# Patient Record
Sex: Female | Born: 1980 | Race: White | Hispanic: No | Marital: Married | Smoking: Never smoker
Health system: Southern US, Community
[De-identification: ages and names within clinical notes are randomized; demographics above are authoritative.]

## PROBLEM LIST (undated history)

## (undated) DIAGNOSIS — R233 Spontaneous ecchymoses: Secondary | ICD-10-CM

## (undated) DIAGNOSIS — R51 Headache: Secondary | ICD-10-CM

## (undated) DIAGNOSIS — M1990 Unspecified osteoarthritis, unspecified site: Secondary | ICD-10-CM

## (undated) DIAGNOSIS — J45909 Unspecified asthma, uncomplicated: Secondary | ICD-10-CM

## (undated) DIAGNOSIS — Z8614 Personal history of Methicillin resistant Staphylococcus aureus infection: Secondary | ICD-10-CM

## (undated) DIAGNOSIS — Z8709 Personal history of other diseases of the respiratory system: Secondary | ICD-10-CM

## (undated) DIAGNOSIS — M797 Fibromyalgia: Secondary | ICD-10-CM

## (undated) DIAGNOSIS — R238 Other skin changes: Secondary | ICD-10-CM

## (undated) DIAGNOSIS — J3501 Chronic tonsillitis: Principal | ICD-10-CM

## (undated) DIAGNOSIS — F419 Anxiety disorder, unspecified: Secondary | ICD-10-CM

## (undated) HISTORY — PX: TUBAL LIGATION: SHX77

## (undated) HISTORY — PX: TONSILLECTOMY: SUR1361

## (undated) HISTORY — PX: WISDOM TOOTH EXTRACTION: SHX21

---
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
## 2005-10-03 DIAGNOSIS — Z8709 Personal history of other diseases of the respiratory system: Secondary | ICD-10-CM

## 2005-10-03 DIAGNOSIS — Z8614 Personal history of Methicillin resistant Staphylococcus aureus infection: Secondary | ICD-10-CM

## 2005-10-03 HISTORY — PX: CHEST TUBE INSERTION: SHX231

## 2005-10-03 HISTORY — DX: Personal history of Methicillin resistant Staphylococcus aureus infection: Z86.14

## 2005-10-03 HISTORY — DX: Personal history of other diseases of the respiratory system: Z87.09

## 2006-02-09 ENCOUNTER — Ambulatory Visit: Payer: Self-pay | Admitting: Family Medicine

## 2006-02-13 ENCOUNTER — Ambulatory Visit: Payer: Self-pay | Admitting: Family Medicine

## 2006-02-16 ENCOUNTER — Ambulatory Visit: Payer: Self-pay | Admitting: Family Medicine

## 2008-09-13 ENCOUNTER — Emergency Department (HOSPITAL_COMMUNITY): Admission: EM | Admit: 2008-09-13 | Discharge: 2008-09-13 | Payer: Self-pay | Admitting: Emergency Medicine

## 2009-07-22 ENCOUNTER — Ambulatory Visit (HOSPITAL_COMMUNITY): Admission: RE | Admit: 2009-07-22 | Discharge: 2009-07-23 | Payer: Self-pay | Admitting: Neurosurgery

## 2009-07-22 HISTORY — PX: ANTERIOR CERVICAL DECOMP/DISCECTOMY FUSION: SHX1161

## 2009-09-16 ENCOUNTER — Encounter: Admission: RE | Admit: 2009-09-16 | Discharge: 2009-09-16 | Payer: Self-pay | Admitting: Neurosurgery

## 2010-11-30 ENCOUNTER — Encounter (HOSPITAL_BASED_OUTPATIENT_CLINIC_OR_DEPARTMENT_OTHER): Payer: Federal, State, Local not specified - PPO | Admitting: Oncology

## 2010-11-30 DIAGNOSIS — R233 Spontaneous ecchymoses: Secondary | ICD-10-CM

## 2010-12-28 ENCOUNTER — Other Ambulatory Visit: Payer: Self-pay | Admitting: Oncology

## 2010-12-28 ENCOUNTER — Encounter (HOSPITAL_BASED_OUTPATIENT_CLINIC_OR_DEPARTMENT_OTHER): Payer: Federal, State, Local not specified - PPO | Admitting: Oncology

## 2010-12-28 DIAGNOSIS — R233 Spontaneous ecchymoses: Secondary | ICD-10-CM

## 2010-12-28 LAB — CBC WITH DIFFERENTIAL/PLATELET
BASO%: 0.2 % (ref 0.0–2.0)
Basophils Absolute: 0 10*3/uL (ref 0.0–0.1)
LYMPH%: 32.9 % (ref 14.0–49.7)
MCV: 85.6 fL (ref 79.5–101.0)
MONO%: 4.5 % (ref 0.0–14.0)
NEUT%: 61.1 % (ref 38.4–76.8)
Platelets: 304 10*3/uL (ref 145–400)
RDW: 12.4 % (ref 11.2–14.5)
WBC: 4.5 10*3/uL (ref 3.9–10.3)

## 2010-12-29 LAB — COMPREHENSIVE METABOLIC PANEL
ALT: 13 U/L (ref 0–35)
Albumin: 4.3 g/dL (ref 3.5–5.2)
Alkaline Phosphatase: 45 U/L (ref 39–117)
BUN: 10 mg/dL (ref 6–23)
CO2: 21 mEq/L (ref 19–32)
Chloride: 109 mEq/L (ref 96–112)
Total Protein: 7.2 g/dL (ref 6.0–8.3)

## 2010-12-29 LAB — PROTHROMBIN TIME: INR: 0.98 (ref <1.50)

## 2011-01-06 LAB — CBC
HCT: 39.1 % (ref 36.0–46.0)
MCHC: 34.5 g/dL (ref 30.0–36.0)
MCV: 88 fL (ref 78.0–100.0)
Platelets: 247 10*3/uL (ref 150–400)
RBC: 4.44 MIL/uL (ref 3.87–5.11)
RDW: 12.9 % (ref 11.5–15.5)
WBC: 6.8 10*3/uL (ref 4.0–10.5)

## 2011-05-05 ENCOUNTER — Other Ambulatory Visit: Payer: Self-pay | Admitting: Oncology

## 2011-05-05 ENCOUNTER — Encounter (HOSPITAL_BASED_OUTPATIENT_CLINIC_OR_DEPARTMENT_OTHER): Payer: Federal, State, Local not specified - PPO | Admitting: Oncology

## 2011-05-05 DIAGNOSIS — R233 Spontaneous ecchymoses: Secondary | ICD-10-CM

## 2011-05-05 LAB — CBC WITH DIFFERENTIAL/PLATELET
Basophils Absolute: 0 10*3/uL (ref 0.0–0.1)
HCT: 35.6 % (ref 34.8–46.6)
HGB: 12.2 g/dL (ref 11.6–15.9)
MCH: 29.7 pg (ref 25.1–34.0)
MCHC: 34.3 g/dL (ref 31.5–36.0)
MCV: 86.8 fL (ref 79.5–101.0)
Platelets: 286 10*3/uL (ref 145–400)
RDW: 12.6 % (ref 11.2–14.5)

## 2011-05-09 LAB — VON WILLEBRAND PANEL
Coagulation Factor VIII: 193 % — ABNORMAL HIGH (ref 73–140)
Ristocetin Co-factor, Plasma: 125 % (ref 42–200)
Von Willebrand Antigen, Plasma: 108 % (ref 50–217)

## 2011-07-08 LAB — URINE MICROSCOPIC-ADD ON

## 2011-07-08 LAB — URINALYSIS, ROUTINE W REFLEX MICROSCOPIC
Bilirubin Urine: NEGATIVE
Ketones, ur: NEGATIVE mg/dL
Nitrite: NEGATIVE

## 2012-05-30 ENCOUNTER — Other Ambulatory Visit: Payer: Self-pay | Admitting: Internal Medicine

## 2012-05-30 DIAGNOSIS — M545 Low back pain: Secondary | ICD-10-CM

## 2012-06-06 ENCOUNTER — Ambulatory Visit
Admission: RE | Admit: 2012-06-06 | Discharge: 2012-06-06 | Disposition: A | Discharge status: Home / Self Care | Payer: Federal, State, Local not specified - PPO | Source: Ambulatory Visit | Attending: Internal Medicine | Admitting: Internal Medicine

## 2012-06-06 DIAGNOSIS — M545 Low back pain: Secondary | ICD-10-CM

## 2012-09-02 DIAGNOSIS — J3501 Chronic tonsillitis: Secondary | ICD-10-CM

## 2012-09-02 HISTORY — DX: Chronic tonsillitis: J35.01

## 2012-09-24 ENCOUNTER — Encounter (HOSPITAL_BASED_OUTPATIENT_CLINIC_OR_DEPARTMENT_OTHER): Payer: Self-pay | Admitting: *Deleted

## 2012-09-24 NOTE — Pre-Procedure Instructions (Signed)
Sleep study results req. from [facility]

## 2012-10-01 ENCOUNTER — Encounter (HOSPITAL_BASED_OUTPATIENT_CLINIC_OR_DEPARTMENT_OTHER): Payer: Self-pay

## 2012-10-01 ENCOUNTER — Encounter (HOSPITAL_BASED_OUTPATIENT_CLINIC_OR_DEPARTMENT_OTHER): Payer: Self-pay | Admitting: Anesthesiology

## 2012-10-01 ENCOUNTER — Encounter (HOSPITAL_BASED_OUTPATIENT_CLINIC_OR_DEPARTMENT_OTHER)
Admission: RE | Disposition: A | Discharge status: Home / Self Care | Payer: Self-pay | Source: Ambulatory Visit | Attending: Otolaryngology

## 2012-10-01 ENCOUNTER — Ambulatory Visit (HOSPITAL_BASED_OUTPATIENT_CLINIC_OR_DEPARTMENT_OTHER): Payer: Federal, State, Local not specified - PPO | Admitting: Anesthesiology

## 2012-10-01 ENCOUNTER — Ambulatory Visit (HOSPITAL_BASED_OUTPATIENT_CLINIC_OR_DEPARTMENT_OTHER)
Admission: RE | Admit: 2012-10-01 | Discharge: 2012-10-01 | Disposition: A | Discharge status: Home / Self Care | Payer: Federal, State, Local not specified - PPO | Source: Ambulatory Visit | Attending: Otolaryngology | Admitting: Otolaryngology

## 2012-10-01 DIAGNOSIS — Z8614 Personal history of Methicillin resistant Staphylococcus aureus infection: Secondary | ICD-10-CM | POA: Insufficient documentation

## 2012-10-01 DIAGNOSIS — J3501 Chronic tonsillitis: Secondary | ICD-10-CM | POA: Insufficient documentation

## 2012-10-01 DIAGNOSIS — M1710 Unilateral primary osteoarthritis, unspecified knee: Secondary | ICD-10-CM | POA: Insufficient documentation

## 2012-10-01 DIAGNOSIS — J45909 Unspecified asthma, uncomplicated: Secondary | ICD-10-CM | POA: Insufficient documentation

## 2012-10-01 DIAGNOSIS — Z9089 Acquired absence of other organs: Secondary | ICD-10-CM

## 2012-10-01 DIAGNOSIS — IMO0001 Reserved for inherently not codable concepts without codable children: Secondary | ICD-10-CM | POA: Insufficient documentation

## 2012-10-01 HISTORY — DX: Personal history of Methicillin resistant Staphylococcus aureus infection: Z86.14

## 2012-10-01 HISTORY — DX: Unspecified asthma, uncomplicated: J45.909

## 2012-10-01 HISTORY — DX: Headache: R51

## 2012-10-01 HISTORY — DX: Chronic tonsillitis: J35.01

## 2012-10-01 HISTORY — DX: Personal history of other diseases of the respiratory system: Z87.09

## 2012-10-01 HISTORY — DX: Unspecified osteoarthritis, unspecified site: M19.90

## 2012-10-01 HISTORY — PX: TONSILLECTOMY: SHX5217

## 2012-10-01 HISTORY — DX: Fibromyalgia: M79.7

## 2012-10-01 SURGERY — TONSILLECTOMY
Anesthesia: General | Site: Mouth | Wound class: Clean Contaminated

## 2012-10-01 MED ORDER — HYDROCODONE-ACETAMINOPHEN 7.5-500 MG/15ML PO SOLN
10.0000 mL | ORAL | Status: DC | PRN
  Administered 2012-10-01 (×2): 15 mL via ORAL

## 2012-10-01 MED ORDER — PHENOL 1.4 % MT LIQD: 1.0000 | OROMUCOSAL | Status: DC | PRN

## 2012-10-01 MED ORDER — PROMETHAZINE HCL 25 MG PO TABS: 25.0000 mg | ORAL_TABLET | Freq: Four times a day (QID) | ORAL | Status: DC | PRN

## 2012-10-01 MED ORDER — FENTANYL CITRATE 0.05 MG/ML IJ SOLN: 50.0000 ug | INTRAMUSCULAR | Status: DC | PRN

## 2012-10-01 MED ORDER — DEXTROSE-NACL 5-0.9 % IV SOLN
INTRAVENOUS | Status: DC
  Administered 2012-10-01: 10:00:00 via INTRAVENOUS

## 2012-10-01 MED ORDER — MIDAZOLAM HCL 5 MG/5ML IJ SOLN
INTRAMUSCULAR | Status: DC | PRN
  Administered 2012-10-01: 2 mg via INTRAVENOUS

## 2012-10-01 MED ORDER — FENTANYL CITRATE 0.05 MG/ML IJ SOLN
INTRAMUSCULAR | Status: DC | PRN
  Administered 2012-10-01: 100 ug via INTRAVENOUS
  Administered 2012-10-01 (×2): 50 ug via INTRAVENOUS

## 2012-10-01 MED ORDER — MIDAZOLAM HCL 2 MG/2ML IJ SOLN: 1.0000 mg | INTRAMUSCULAR | Status: DC | PRN

## 2012-10-01 MED ORDER — MEPERIDINE HCL 25 MG/ML IJ SOLN: 6.2500 mg | INTRAMUSCULAR | Status: DC | PRN

## 2012-10-01 MED ORDER — OXYCODONE HCL 5 MG/5ML PO SOLN: 5.0000 mg | Freq: Once | ORAL | Status: DC | PRN

## 2012-10-01 MED ORDER — LIDOCAINE HCL (CARDIAC) 20 MG/ML IV SOLN
INTRAVENOUS | Status: DC | PRN
  Administered 2012-10-01: 80 mg via INTRAVENOUS

## 2012-10-01 MED ORDER — PROMETHAZINE HCL 25 MG RE SUPP: 25.0000 mg | Freq: Four times a day (QID) | RECTAL | Status: DC | PRN

## 2012-10-01 MED ORDER — IBUPROFEN 100 MG/5ML PO SUSP
400.0000 mg | Freq: Four times a day (QID) | ORAL | Status: DC | PRN
  Administered 2012-10-01: 20 mg via ORAL

## 2012-10-01 MED ORDER — OXYCODONE HCL 5 MG PO TABS: 5.0000 mg | ORAL_TABLET | Freq: Once | ORAL | Status: DC | PRN

## 2012-10-01 MED ORDER — LACTATED RINGERS IV SOLN
INTRAVENOUS | Status: DC
  Administered 2012-10-01 (×2): via INTRAVENOUS

## 2012-10-01 MED ORDER — ALBUTEROL SULFATE HFA 108 (90 BASE) MCG/ACT IN AERS: 2.0000 | INHALATION_SPRAY | Freq: Four times a day (QID) | RESPIRATORY_TRACT | Status: DC | PRN

## 2012-10-01 MED ORDER — PROMETHAZINE HCL 25 MG/ML IJ SOLN: 6.2500 mg | INTRAMUSCULAR | Status: DC | PRN

## 2012-10-01 MED ORDER — TOPIRAMATE 100 MG PO TABS: 100.0000 mg | ORAL_TABLET | Freq: Two times a day (BID) | ORAL | Status: DC

## 2012-10-01 MED ORDER — PROPOFOL 10 MG/ML IV BOLUS
INTRAVENOUS | Status: DC | PRN
  Administered 2012-10-01: 160 mg via INTRAVENOUS

## 2012-10-01 MED ORDER — DEXAMETHASONE SODIUM PHOSPHATE 4 MG/ML IJ SOLN
INTRAMUSCULAR | Status: DC | PRN
  Administered 2012-10-01: 10 mg via INTRAVENOUS

## 2012-10-01 MED ORDER — HYDROCODONE-ACETAMINOPHEN 7.5-500 MG/15ML PO SOLN: 15.0000 mL | Freq: Four times a day (QID) | ORAL | Status: DC | PRN

## 2012-10-01 MED ORDER — HYDROMORPHONE HCL PF 1 MG/ML IJ SOLN
0.2500 mg | INTRAMUSCULAR | Status: DC | PRN
  Administered 2012-10-01 (×4): 0.5 mg via INTRAVENOUS

## 2012-10-01 MED ORDER — ONDANSETRON HCL 4 MG/2ML IJ SOLN
INTRAMUSCULAR | Status: DC | PRN
  Administered 2012-10-01: 4 mg via INTRAVENOUS

## 2012-10-01 MED ORDER — PROPOFOL INFUSION 10 MG/ML OPTIME
INTRAVENOUS | Status: DC | PRN
  Administered 2012-10-01: 100 ug/kg/min via INTRAVENOUS

## 2012-10-01 SURGICAL SUPPLY — 27 items
CANISTER SUCTION 1200CC (MISCELLANEOUS) ×2 IMPLANT
CATH ROBINSON RED A/P 12FR (CATHETERS) ×2 IMPLANT
CLOTH BEACON ORANGE TIMEOUT ST (SAFETY) ×2 IMPLANT
COAGULATOR SUCT SWTCH 10FR 6 (ELECTROSURGICAL) ×2 IMPLANT
COVER MAYO STAND STRL (DRAPES) ×2 IMPLANT
ELECT COATED BLADE 2.86 ST (ELECTRODE) ×2 IMPLANT
ELECT REM PT RETURN 9FT ADLT (ELECTROSURGICAL)
ELECT REM PT RETURN 9FT PED (ELECTROSURGICAL)
ELECTRODE REM PT RETRN 9FT PED (ELECTROSURGICAL) IMPLANT
ELECTRODE REM PT RTRN 9FT ADLT (ELECTROSURGICAL) IMPLANT
GAUZE SPONGE 4X4 12PLY STRL LF (GAUZE/BANDAGES/DRESSINGS) ×2 IMPLANT
GLOVE ECLIPSE 6.5 STRL STRAW (GLOVE) ×2 IMPLANT
GLOVE ECLIPSE 7.5 STRL STRAW (GLOVE) ×2 IMPLANT
GOWN PREVENTION PLUS XLARGE (GOWN DISPOSABLE) IMPLANT
MARKER SKIN DUAL TIP RULER LAB (MISCELLANEOUS) IMPLANT
NS IRRIG 1000ML POUR BTL (IV SOLUTION) ×2 IMPLANT
PENCIL FOOT CONTROL (ELECTRODE) ×2 IMPLANT
SHEET MEDIUM DRAPE 40X70 STRL (DRAPES) ×2 IMPLANT
SOLUTION BUTLER CLEAR DIP (MISCELLANEOUS) IMPLANT
SPONGE TONSIL 1 RF SGL (DISPOSABLE) IMPLANT
SPONGE TONSIL 1.25 RF SGL STRG (GAUZE/BANDAGES/DRESSINGS) IMPLANT
SYR BULB 3OZ (MISCELLANEOUS) ×2 IMPLANT
TOWEL OR 17X24 6PK STRL BLUE (TOWEL DISPOSABLE) ×2 IMPLANT
TUBE CONNECTING 20X1/4 (TUBING) ×2 IMPLANT
TUBE SALEM SUMP 12R W/ARV (TUBING) IMPLANT
TUBE SALEM SUMP 16 FR W/ARV (TUBING) ×2 IMPLANT
WATER STERILE IRR 1000ML POUR (IV SOLUTION) ×2 IMPLANT

## 2012-10-01 NOTE — Transfer of Care (Signed)
Immediate Anesthesia Transfer of Care Note  Patient: Alexis Carrillo  Procedure(s) Performed: Procedure(s) (LRB) with comments: TONSILLECTOMY (N/A)  Patient Location: PACU  Anesthesia Type:General  Level of Consciousness: awake, sedated and patient cooperative  Airway & Oxygen Therapy: Patient Spontanous Breathing and Patient connected to face mask oxygen  Post-op Assessment: Report given to PACU RN and Post -op Vital signs reviewed and stable  Post vital signs: Reviewed and stable  Complications: No apparent anesthesia complications

## 2012-10-01 NOTE — H&P (Signed)
Reason for Consult: Chronic tonsillitis Referring Physician: Serena Colonel, MD  Alexis Carrillo is an 31 y.o. female.  HPI: History of chronic and recurring tonsillopharyngitis. Here for tonsillectomy.  Past Medical History  Diagnosis Date  . Headache     migraines  . Fibromyalgia   . Arthritis     knees  . Chronic tonsillitis 09/2012  . Asthma     prn inhaler  . History of empyema of pleura 2007  . History of MRSA infection 2007    face    Past Surgical History  Procedure Date  . Anterior cervical decomp/discectomy fusion 07/22/2009    C6-7  . Knee arthroscopy w/ acl reconstruction 1998    right  . Cesarean section 2003; 2005  . Chest tube insertion 2007  . Wisdom tooth extraction     History reviewed. No pertinent family history.  Social History:  reports that she has never smoked. She has never used smokeless tobacco. She reports that she does not drink alcohol or use illicit drugs.  Allergies: No Known Allergies  Medications: Reviewed  No results found for this or any previous visit (from the past 48 hour(s)).  No results found.  JYN:[id number] except as listed in admit H&P  Blood pressure 99/61, pulse 64, temperature 97.6 F (36.4 C), temperature source Oral, resp. rate 20, height 5\' 7"  (1.702 m), weight 170 lb (77.111 kg), last menstrual period 09/08/2012, SpO2 100.00%.  PHYSICAL EXAM: Overall appearance:  Healthy appearing, in no distress Head:  Normocephalic, atraumatic. Ears: External auditory canals are clear; tympanic membranes are intact in the middle ears are free of any effusion. Nose: External nose is healthy in appearance. Internal nasal exam free of any lesions or obstruction. Oral Cavity:  There are no mucosal lesions or masses identified. Oral Pharynx/Hypopharynx/Larynx: no signs of any mucosal lesions or masses identified.  Neuro:  No identifiable neurologic deficits. Neck: No palpable neck masses.  Studies Reviewed: none  Procedures:  none   Assessment/Plan: Proceed with tonsillectomy.  Alexis Carrillo 10/01/2012, 7:44 AM

## 2012-10-01 NOTE — Op Note (Signed)
10/01/2012  8:25 AM  PATIENT:  Alexis Carrillo  31 y.o. female  PRE-OPERATIVE DIAGNOSIS:  CHRONIC TONSILITIS  POST-OPERATIVE DIAGNOSIS:  CHRONIC TONSILITIS  PROCEDURE:  Procedure(s): TONSILLECTOMY  SURGEON:  Surgeon(s): Serena Colonel, MD  ANESTHESIA:   General  COUNTS: Correct   DICTATION: The patient was taken to the operating room and placed on the operating table in the supine position. Following induction of general endotracheal anesthesia, the table was turned and the patient was draped in a standard fashion. A Crowe-Davis mouthgag was inserted into the oral cavity and used to retract the tongue and mandible, then attached to the Mayo stand.  The tonsillectomy was then performed using electrocautery dissection, carefully dissecting the avascular plane between the capsule and constrictor muscles. Cautery was used for completion of hemostasis. The tonsils were discarded. They were large and cryptic with large amounts of calculi.  The pharynx was irrigated with saline and suctioned. An oral gastric tube was used to aspirate the contents of the stomach. The patient was then awakened from anesthesia and transferred to PACU in stable condition.   PATIENT DISPOSITION:  To PACA, stable

## 2012-10-01 NOTE — Anesthesia Preprocedure Evaluation (Signed)
Anesthesia Evaluation  Patient identified by MRN, date of birth, ID band Patient awake    Reviewed: Allergy & Precautions  History of Anesthesia Complications Negative for: history of anesthetic complications  Airway Mallampati: I      Dental  (+) Teeth Intact   Pulmonary asthma ,  breath sounds clear to auscultation        Cardiovascular negative cardio ROS  Rhythm:Regular Rate:Normal     Neuro/Psych  Headaches,  Neuromuscular disease    GI/Hepatic negative GI ROS, Neg liver ROS,   Endo/Other    Renal/GU negative Renal ROS     Musculoskeletal  (+) Fibromyalgia -  Abdominal   Peds  Hematology  (+) Blood dyscrasia, ,   Anesthesia Other Findings   Reproductive/Obstetrics                           Anesthesia Physical Anesthesia Plan  ASA: II  Anesthesia Plan: General   Post-op Pain Management:    Induction: Intravenous  Airway Management Planned: Oral ETT  Additional Equipment:   Intra-op Plan:   Post-operative Plan: Extubation in OR  Informed Consent: I have reviewed the patients History and Physical, chart, labs and discussed the procedure including the risks, benefits and alternatives for the proposed anesthesia with the patient or authorized representative who has indicated his/her understanding and acceptance.   Dental advisory given  Plan Discussed with: CRNA and Surgeon  Anesthesia Plan Comments:         Anesthesia Quick Evaluation

## 2012-10-01 NOTE — Anesthesia Procedure Notes (Signed)
Procedure Name: Intubation Date/Time: 10/01/2012 8:09 AM Performed by: Burna Cash Pre-anesthesia Checklist: Patient identified, Emergency Drugs available, Suction available and Patient being monitored Patient Re-evaluated:Patient Re-evaluated prior to inductionOxygen Delivery Method: Circle System Utilized Preoxygenation: Pre-oxygenation with 100% oxygen Intubation Type: IV induction Ventilation: Mask ventilation without difficulty Laryngoscope Size: Mac and 3 Grade View: Grade I Tube type: Oral Tube size: 7.0 mm Number of attempts: 1 Airway Equipment and Method: stylet and oral airway Placement Confirmation: ETT inserted through vocal cords under direct vision,  positive ETCO2 and breath sounds checked- equal and bilateral Secured at: 21 cm Tube secured with: Tape Dental Injury: Teeth and Oropharynx as per pre-operative assessment

## 2012-10-01 NOTE — Anesthesia Postprocedure Evaluation (Signed)
  Anesthesia Post-op Note  Patient: Alexis Carrillo  Procedure(s) Performed: Procedure(s) (LRB) with comments: TONSILLECTOMY (N/A)  Patient Location: PACU  Anesthesia Type:General  Level of Consciousness: awake  Airway and Oxygen Therapy: Patient Spontanous Breathing  Post-op Pain: mild  Post-op Assessment: Post-op Vital signs reviewed  Post-op Vital Signs: stable  Complications: No apparent anesthesia complications

## 2012-10-02 ENCOUNTER — Encounter (HOSPITAL_BASED_OUTPATIENT_CLINIC_OR_DEPARTMENT_OTHER): Payer: Self-pay | Admitting: Otolaryngology

## 2013-09-19 ENCOUNTER — Encounter: Payer: Self-pay | Admitting: Podiatry

## 2013-09-19 ENCOUNTER — Ambulatory Visit (INDEPENDENT_AMBULATORY_CARE_PROVIDER_SITE_OTHER): Payer: Federal, State, Local not specified - PPO | Admitting: Podiatry

## 2013-09-19 ENCOUNTER — Ambulatory Visit (INDEPENDENT_AMBULATORY_CARE_PROVIDER_SITE_OTHER): Payer: Federal, State, Local not specified - PPO

## 2013-09-19 VITALS — BP 116/76 | HR 68 | Resp 12

## 2013-09-19 DIAGNOSIS — R52 Pain, unspecified: Secondary | ICD-10-CM

## 2013-09-19 DIAGNOSIS — M722 Plantar fascial fibromatosis: Secondary | ICD-10-CM

## 2013-09-19 MED ORDER — TRIAMCINOLONE ACETONIDE 10 MG/ML IJ SUSP
10.0000 mg | Freq: Once | INTRAMUSCULAR | Status: AC
  Administered 2013-09-19: 10 mg

## 2013-09-19 NOTE — Progress Notes (Signed)
Subjective:     Patient ID: Alexis Carrillo, female   DOB: 1980-12-19, 32 y.o.   MRN: [id number]  HPI patient states that both my heels are killing me. States that they were some better after treatment of 2-1/2 years ago but they been very bad for the last 6 months and she has been trying to exercise and lose weight and this prevents her from doing   Review of Systems     Objective:   Physical Exam Neurovascular status intact with pain in the plantar fascia both feet right over left    Assessment:     Plantar fasciitis of a chronic nature and acute manifestation heels right over left    Plan:     Reviewed condition and discussed options. Today I injected the plantar fascia both feet 3 mg Kenalog 5 mg Xylocaine Marcaine mixture and dispensed night splint with instructions on usage and scanned for custom orthotic devices. I explained that this will take a long time to get better and will need to be continually treated for a while

## 2013-11-07 ENCOUNTER — Encounter: Payer: Self-pay | Admitting: Podiatry

## 2013-11-07 ENCOUNTER — Ambulatory Visit (INDEPENDENT_AMBULATORY_CARE_PROVIDER_SITE_OTHER): Payer: Federal, State, Local not specified - PPO | Admitting: Podiatry

## 2013-11-07 VITALS — BP 103/57 | HR 59 | Resp 16

## 2013-11-07 DIAGNOSIS — M722 Plantar fascial fibromatosis: Secondary | ICD-10-CM

## 2013-11-07 MED ORDER — TRIAMCINOLONE ACETONIDE 10 MG/ML IJ SUSP
10.0000 mg | Freq: Once | INTRAMUSCULAR | Status: AC
  Administered 2013-11-07: 10 mg

## 2013-11-07 NOTE — Progress Notes (Signed)
Subjective:     Patient ID: Chip BoerJenny L Brabec, female   DOB: Feb 28, 1981, 33 y.o.   MRN: [id number]  HPI patient states that my heel is still hurting some on my right but overall IM improving   Review of Systems     Objective:   Physical Exam Neurovascular status intact with discomfort in the plantar heel right over left at the insertion of the tendon into the calcaneus    Assessment:     Plantar fasciitis heel region right over left with inflammation noted    Plan:     H&P performed and x-ray reviewed. Injected the right fascia 3 mm Kenalog 5 mg Marcaine mixture and instructed on physical therapy. Dispensed orthotics

## 2013-11-07 NOTE — Patient Instructions (Addendum)
WEARING INSTRUCTIONS FOR ORTHOTICS  Don't expect to be comfortable wearing your orthotic devices for the first time.  Like eyeglasses, you may be aware of them as time passes, they will not be uncomfortable and you will enjoy wearing them.  FOLLOW THESE INSTRUCTIONS EXACTLY!  1. Wear your orthotic devices for:       Not more than 1 hour the first day.       Not more than 2 hours the second day.       Not more than 3 hours the third day and so on.        Or wear them for as long as they feel comfortable.       If you experience discomfort in your feet or legs take them out.  When feet & legs feel       better, put them back in.  You do need to be consistent and wear them a little        everyday. 2.   If at any time the orthotic devices become acutely uncomfortable before the       time for that particular day, STOP WEARING THEM. 3.   On the next day, do not increase the wearing time. 4.   Subsequently, increase the wearing time by 15-30 minutes only if comfortable to do       so. 5.   You will be seen by your doctor about 2-4 weeks after you receive your orthotic       devices, at which time you will probably be wearing your devices comfortably        for about 8 hours or more a day. 6.   Some patients occasionally report mild aches or discomfort in other parts of the of       body such as the knees, hips or back after 3 or 4 consecutive hours of wear.  If this       is the case with you, do not extend your wearing time.  Instead, cut it back an hour or       two.  In all likelihood, these symptoms will disappear in a short period of time as your       body posture realigns itself and functions more efficiently. 7.   It is possible that your orthotic device may require some small changes or adjustment       to improve their function or make them more comfortable.   This is usually not done       before one to three months have elapsed.  These adjustments are made in        accordance  with the changed position your feet are assuming as a result of       improved biomechanical function. 8.   In women's shoes, it's not unusual for your heel to slip out of the shoe, particularly if       they are step-in-shoes.  If this is the case, try other shoes or other styles.  Try to       purchase shoes which have deeper heal seats or higher heel counters. 9.   Squeaking of orthotics devices in the shoes is due to the movement of the devices       when they are functioning normally.  To eliminate squeaking, simply dust some       baby powder into your shoes before inserting the devices.  If this does not work,          apply soap or wax to the edges of the orthotic devices or put a tissue into the shoes. 10. It is important that you follow these directions explicitly.  Failure to do so will simply       prolong the adjustment period or create problems which are easily avoided.  It makes       no difference if you are wearing your orthotic devices for only a few hours after        several months, so long as you are wearing them comfortably for those hours. 11. If you have any questions or complaints, contact our office.  We have no way of       knowing about your problems unless you tell us.  If we do not hear from you, we will       assume that you are proceeding well.  Plantar Fasciitis (Heel Spur Syndrome) with Rehab The plantar fascia is a fibrous, ligament-like, soft-tissue structure that spans the bottom of the foot. Plantar fasciitis is a condition that causes pain in the foot due to inflammation of the tissue. SYMPTOMS   Pain and tenderness on the underneath side of the foot.  Pain that worsens with standing or walking. CAUSES  Plantar fasciitis is caused by irritation and injury to the plantar fascia on the underneath side of the foot. Common mechanisms of injury include:  Direct trauma to bottom of the foot.  Damage to a small nerve that runs under the foot where the main  fascia attaches to the heel bone.  Stress placed on the plantar fascia due to bone spurs. RISK INCREASES WITH:   Activities that place stress on the plantar fascia (running, jumping, pivoting, or cutting).  Poor strength and flexibility.  Improperly fitted shoes.  Tight calf muscles.  Flat feet.  Failure to warm-up properly before activity.  Obesity. PREVENTION  Warm up and stretch properly before activity.  Allow for adequate recovery between workouts.  Maintain physical fitness:  Strength, flexibility, and endurance.  Cardiovascular fitness.  Maintain a health body weight.  Avoid stress on the plantar fascia.  Wear properly fitted shoes, including arch supports for individuals who have flat feet.  PROGNOSIS  If treated properly, then the symptoms of plantar fasciitis usually resolve without surgery. However, occasionally surgery is necessary.  RELATED COMPLICATIONS   Recurrent symptoms that may result in a chronic condition.  Problems of the lower back that are caused by compensating for the injury, such as limping.  Pain or weakness of the foot during push-off following surgery.  Chronic inflammation, scarring, and partial or complete fascia tear, occurring more often from repeated injections.  TREATMENT  Treatment initially involves the use of ice and medication to help reduce pain and inflammation. The use of strengthening and stretching exercises may help reduce pain with activity, especially stretches of the Achilles tendon. These exercises may be performed at home or with a therapist. Your caregiver may recommend that you use heel cups of arch supports to help reduce stress on the plantar fascia. Occasionally, corticosteroid injections are given to reduce inflammation. If symptoms persist for greater than 6 months despite non-surgical (conservative), then surgery may be recommended.   MEDICATION   If pain medication is necessary, then nonsteroidal  anti-inflammatory medications, such as aspirin and ibuprofen, or other minor pain relievers, such as acetaminophen, are often recommended.  Do not take pain medication within 7 days before surgery.  Prescription pain relievers may be given if deemed necessary by your   caregiver. Use only as directed and only as much as you need.  Corticosteroid injections may be given by your caregiver. These injections should be reserved for the most serious cases, because they may only be given a certain number of times.  HEAT AND COLD  Cold treatment (icing) relieves pain and reduces inflammation. Cold treatment should be applied for 10 to 15 minutes every 2 to 3 hours for inflammation and pain and immediately after any activity that aggravates your symptoms. Use ice packs or massage the area with a piece of ice (ice massage).  Heat treatment may be used prior to performing the stretching and strengthening activities prescribed by your caregiver, physical therapist, or athletic trainer. Use a heat pack or soak the injury in warm water.  SEEK IMMEDIATE MEDICAL CARE IF:  Treatment seems to offer no benefit, or the condition worsens.  Any medications produce adverse side effects.  EXERCISES- RANGE OF MOTION (ROM) AND STRETCHING EXERCISES - Plantar Fasciitis (Heel Spur Syndrome) These exercises may help you when beginning to rehabilitate your injury. Your symptoms may resolve with or without further involvement from your physician, physical therapist or athletic trainer. While completing these exercises, remember:   Restoring tissue flexibility helps normal motion to return to the joints. This allows healthier, less painful movement and activity.  An effective stretch should be held for at least 30 seconds.  A stretch should never be painful. You should only feel a gentle lengthening or release in the stretched tissue.  RANGE OF MOTION - Toe Extension, Flexion  Sit with your right / left leg crossed  over your opposite knee.  Grasp your toes and gently pull them back toward the top of your foot. You should feel a stretch on the bottom of your toes and/or foot.  Hold this stretch for 10 seconds.  Now, gently pull your toes toward the bottom of your foot. You should feel a stretch on the top of your toes and or foot.  Hold this stretch for 10 seconds. Repeat  times. Complete this stretch 3 times per day.   RANGE OF MOTION - Ankle Dorsiflexion, Active Assisted  Remove shoes and sit on a chair that is preferably not on a carpeted surface.  Place right / left foot under knee. Extend your opposite leg for support.  Keeping your heel down, slide your right / left foot back toward the chair until you feel a stretch at your ankle or calf. If you do not feel a stretch, slide your bottom forward to the edge of the chair, while still keeping your heel down.  Hold this stretch for 10 seconds. Repeat 3 times. Complete this stretch 2 times per day.   STRETCH  Gastroc, Standing  Place hands on wall.  Extend right / left leg, keeping the front knee somewhat bent.  Slightly point your toes inward on your back foot.  Keeping your right / left heel on the floor and your knee straight, shift your weight toward the wall, not allowing your back to arch.  You should feel a gentle stretch in the right / left calf. Hold this position for 10 seconds. Repeat 3 times. Complete this stretch 2 times per day.  STRETCH  Soleus, Standing  Place hands on wall.  Extend right / left leg, keeping the other knee somewhat bent.  Slightly point your toes inward on your back foot.  Keep your right / left heel on the floor, bend your back knee, and slightly shift your weight   over the back leg so that you feel a gentle stretch deep in your back calf.  Hold this position for 10 seconds. Repeat 3 times. Complete this stretch 2 times per day.  STRETCH  Gastrocsoleus, Standing  Note: This exercise can place a  lot of stress on your foot and ankle. Please complete this exercise only if specifically instructed by your caregiver.   Place the ball of your right / left foot on a step, keeping your other foot firmly on the same step.  Hold on to the wall or a rail for balance.  Slowly lift your other foot, allowing your body weight to press your heel down over the edge of the step.  You should feel a stretch in your right / left calf.  Hold this position for 10 seconds.  Repeat this exercise with a slight bend in your right / left knee. Repeat 3 times. Complete this stretch 2 times per day.   STRENGTHENING EXERCISES - Plantar Fasciitis (Heel Spur Syndrome)  These exercises may help you when beginning to rehabilitate your injury. They may resolve your symptoms with or without further involvement from your physician, physical therapist or athletic trainer. While completing these exercises, remember:   Muscles can gain both the endurance and the strength needed for everyday activities through controlled exercises.  Complete these exercises as instructed by your physician, physical therapist or athletic trainer. Progress the resistance and repetitions only as guided.  STRENGTH - Towel Curls  Sit in a chair positioned on a non-carpeted surface.  Place your foot on a towel, keeping your heel on the floor.  Pull the towel toward your heel by only curling your toes. Keep your heel on the floor. Repeat 3 times. Complete this exercise 2 times per day.  STRENGTH - Ankle Inversion  Secure one end of a rubber exercise band/tubing to a fixed object (table, pole). Loop the other end around your foot just before your toes.  Place your fists between your knees. This will focus your strengthening at your ankle.  Slowly, pull your big toe up and in, making sure the band/tubing is positioned to resist the entire motion.  Hold this position for 10 seconds.  Have your muscles resist the band/tubing as it  slowly pulls your foot back to the starting position. Repeat 3 times. Complete this exercises 2 times per day.  Document Released: 09/19/2005 Document Revised: 12/12/2011 Document Reviewed: 01/01/2009 [employer] Patient Information 2014 [employer]. 

## 2013-11-08 ENCOUNTER — Other Ambulatory Visit: Payer: Federal, State, Local not specified - PPO

## 2013-12-19 ENCOUNTER — Ambulatory Visit: Payer: Federal, State, Local not specified - PPO | Admitting: Podiatry

## 2013-12-26 ENCOUNTER — Ambulatory Visit: Payer: Federal, State, Local not specified - PPO | Admitting: Podiatry

## 2014-01-09 ENCOUNTER — Encounter: Payer: Self-pay | Admitting: Podiatry

## 2014-01-09 ENCOUNTER — Ambulatory Visit (INDEPENDENT_AMBULATORY_CARE_PROVIDER_SITE_OTHER): Payer: Federal, State, Local not specified - PPO | Admitting: Podiatry

## 2014-01-09 VITALS — BP 108/60 | HR 60 | Resp 12

## 2014-01-09 DIAGNOSIS — M722 Plantar fascial fibromatosis: Secondary | ICD-10-CM

## 2014-01-09 MED ORDER — TRIAMCINOLONE ACETONIDE 10 MG/ML IJ SUSP
10.0000 mg | Freq: Once | INTRAMUSCULAR | Status: AC
Start: 2014-01-09 — End: 2014-01-09
  Administered 2014-01-09: 10 mg

## 2014-01-09 MED ORDER — MELOXICAM 15 MG PO TABS: 15.0000 mg | ORAL_TABLET | Freq: Every day | ORAL | Status: DC

## 2014-01-12 NOTE — Progress Notes (Signed)
Subjective:     Patient ID: Alexis Carrillo, female   DOB: 11/18/1980, 33 y.o.   MRN: [id number]  HPI patient states my heels are improved over where they were but they are still sore when pressed   Review of Systems     Objective:   Physical Exam Neurovascular status intact with continued discomfort in the medial band plantar fascia at the insertion to the calcaneus    Assessment:     Plantar fasciitis still noted medial band heel both feet    Plan:     Reinjected the plantar fascia both feet 3 mg Kenalog 5 mg Xylocaine Marcaine mixture and instructed on continued orthotic usage and physical therapy

## 2014-02-20 ENCOUNTER — Ambulatory Visit: Payer: Federal, State, Local not specified - PPO | Admitting: Podiatry

## 2014-04-02 ENCOUNTER — Ambulatory Visit: Payer: Federal, State, Local not specified - PPO | Admitting: Podiatry

## 2014-06-04 ENCOUNTER — Other Ambulatory Visit: Payer: Self-pay | Admitting: Neurosurgery

## 2014-06-04 DIAGNOSIS — M5412 Radiculopathy, cervical region: Secondary | ICD-10-CM

## 2014-06-12 ENCOUNTER — Ambulatory Visit
Admission: RE | Admit: 2014-06-12 | Discharge: 2014-06-12 | Disposition: A | Discharge status: Home / Self Care | Payer: Federal, State, Local not specified - PPO | Source: Ambulatory Visit | Attending: Neurosurgery | Admitting: Neurosurgery

## 2014-06-12 VITALS — BP 100/64 | HR 72

## 2014-06-12 DIAGNOSIS — M5412 Radiculopathy, cervical region: Secondary | ICD-10-CM

## 2014-06-12 MED ORDER — ONDANSETRON HCL 4 MG/2ML IJ SOLN
4.0000 mg | Freq: Once | INTRAMUSCULAR | Status: AC
  Administered 2014-06-12: 4 mg via INTRAMUSCULAR

## 2014-06-12 MED ORDER — IOHEXOL 300 MG/ML  SOLN
10.0000 mL | Freq: Once | INTRAMUSCULAR | Status: AC | PRN
  Administered 2014-06-12: 10 mL via INTRATHECAL

## 2014-06-12 MED ORDER — HYDROMORPHONE HCL PF 1 MG/ML IJ SOLN
1.0000 mg | Freq: Once | INTRAMUSCULAR | Status: AC
  Administered 2014-06-12: 1 mg via INTRAMUSCULAR

## 2014-06-12 MED ORDER — DIAZEPAM 5 MG PO TABS
10.0000 mg | ORAL_TABLET | Freq: Once | ORAL | Status: AC
  Administered 2014-06-12: 10 mg via ORAL

## 2014-06-12 NOTE — Progress Notes (Signed)
Patient states she has been off Tramadol and Treximet for at least the past two days.  jkl

## 2014-06-12 NOTE — Discharge Instructions (Signed)
Myelogram Discharge Instructions  1. Go home and rest quietly for the next 24 hours.  It is important to lie flat for the next 24 hours.  Get up only to go to the restroom.  You may lie in the bed or on a couch on your back, your stomach, your left side or your right side.  You may have one pillow under your head.  You may have pillows between your knees while you are on your side or under your knees while you are on your back.  2. DO NOT drive today.  Recline the seat as far back as it will go, while still wearing your seat belt, on the way home.  3. You may get up to go to the bathroom as needed.  You may sit up for 10 minutes to eat.  You may resume your normal diet and medications unless otherwise indicated.  Drink plenty of extra fluids today and tomorrow.  4. The incidence of a spinal headache with nausea and/or vomiting is about 5% (one in 20 patients).  If you develop a headache, lie flat and drink plenty of fluids until the headache goes away.  Caffeinated beverages may be helpful.  If you develop severe nausea and vomiting or a headache that does not go away with flat bed rest, call [phone].  5. You may resume normal activities after your 24 hours of bed rest is over; however, do not exert yourself strongly or do any heavy lifting tomorrow.  6. Call your physician for a follow-up appointment.   You may resume Tramadol and Treximet on Friday, June 13, 2014 after 9:30a.m.

## 2014-06-16 ENCOUNTER — Other Ambulatory Visit: Payer: Self-pay | Admitting: Neurosurgery

## 2014-06-16 ENCOUNTER — Ambulatory Visit
Admission: RE | Admit: 2014-06-16 | Discharge: 2014-06-16 | Disposition: A | Discharge status: Home / Self Care | Payer: Federal, State, Local not specified - PPO | Source: Ambulatory Visit | Attending: Neurosurgery | Admitting: Neurosurgery

## 2014-06-16 VITALS — BP 106/54 | HR 51

## 2014-06-16 DIAGNOSIS — G971 Other reaction to spinal and lumbar puncture: Secondary | ICD-10-CM

## 2014-06-16 MED ORDER — IOHEXOL 180 MG/ML  SOLN
1.0000 mL | Freq: Once | INTRAMUSCULAR | Status: AC | PRN
  Administered 2014-06-16: 1 mL via EPIDURAL

## 2014-06-16 NOTE — Discharge Instructions (Signed)

## 2014-06-16 NOTE — Progress Notes (Signed)
20 cc's of blood drawn from left AC. Site is unremarkable and pt tolerated procedure well.

## 2014-12-08 ENCOUNTER — Other Ambulatory Visit (HOSPITAL_COMMUNITY): Payer: Self-pay | Admitting: Internal Medicine

## 2014-12-08 ENCOUNTER — Other Ambulatory Visit: Payer: Self-pay | Admitting: Internal Medicine

## 2014-12-08 DIAGNOSIS — R1011 Right upper quadrant pain: Secondary | ICD-10-CM

## 2014-12-08 DIAGNOSIS — R1030 Lower abdominal pain, unspecified: Secondary | ICD-10-CM

## 2014-12-10 ENCOUNTER — Ambulatory Visit (HOSPITAL_COMMUNITY)
Admission: RE | Admit: 2014-12-10 | Discharge: 2014-12-10 | Disposition: A | Discharge status: Home / Self Care | Payer: Federal, State, Local not specified - PPO | Source: Ambulatory Visit | Attending: Internal Medicine | Admitting: Internal Medicine

## 2014-12-10 ENCOUNTER — Other Ambulatory Visit (HOSPITAL_COMMUNITY): Payer: Federal, State, Local not specified - PPO

## 2014-12-10 DIAGNOSIS — R1030 Lower abdominal pain, unspecified: Secondary | ICD-10-CM | POA: Diagnosis not present

## 2014-12-10 DIAGNOSIS — R1011 Right upper quadrant pain: Secondary | ICD-10-CM

## 2014-12-12 ENCOUNTER — Other Ambulatory Visit: Payer: Self-pay | Admitting: Internal Medicine

## 2014-12-12 DIAGNOSIS — R1011 Right upper quadrant pain: Secondary | ICD-10-CM

## 2014-12-15 ENCOUNTER — Other Ambulatory Visit: Payer: Federal, State, Local not specified - PPO

## 2014-12-17 ENCOUNTER — Ambulatory Visit
Admission: RE | Admit: 2014-12-17 | Discharge: 2014-12-17 | Disposition: A | Discharge status: Home / Self Care | Payer: Federal, State, Local not specified - PPO | Source: Ambulatory Visit | Attending: Internal Medicine | Admitting: Internal Medicine

## 2014-12-17 DIAGNOSIS — R1011 Right upper quadrant pain: Secondary | ICD-10-CM

## 2015-12-31 ENCOUNTER — Other Ambulatory Visit: Payer: Self-pay | Admitting: Anesthesiology

## 2016-01-29 NOTE — Pre-Procedure Instructions (Signed)
    Eber HongJenny L Delano  01/29/2016      [id number] - [place], [place] - [id number] [place] [id number] Phone: [phone] Fax: [phone]    Your procedure is scheduled on 02/05/16.  [facility].M[facility]  Call this number if you have problems the morning of surgery:  [phone]   Remember:  Do not eat food or drink liquids after midnight.  Take these medicines the morning of surgery with A SIP OF WATER --all inhalers,cymbalta,hydrocodone,topramax   Do not wear jewelry, make-up or nail polish.  Do not wear lotions, powders, or perfumes.  You may wear deodorant.  Do not shave 48 hours prior to surgery.  Men may shave face and neck.  Do not bring valuables to the hospital.  [facility] is not responsible for any belongings or valuables.  Contacts, dentures or bridgework may not be worn into surgery.  Leave your suitcase in the car.  After surgery it may be brought to your room.  For patients admitted to the hospital, discharge time will be determined by your treatment team.  Patients discharged the day of surgery will not be allowed to drive home.   Name and phone number of your driver:   Special instructions:   Please read over the following fact sheets that you were given. Pain Booklet, Coughing and Deep Breathing and Surgical Site Infection Prevention

## 2016-02-01 ENCOUNTER — Encounter (HOSPITAL_COMMUNITY): Payer: Self-pay

## 2016-02-01 ENCOUNTER — Encounter (HOSPITAL_COMMUNITY)
Admission: RE | Admit: 2016-02-01 | Discharge: 2016-02-01 | Disposition: A | Discharge status: Home / Self Care | Payer: Federal, State, Local not specified - PPO | Source: Ambulatory Visit | Attending: Anesthesiology | Admitting: Anesthesiology

## 2016-02-01 DIAGNOSIS — M961 Postlaminectomy syndrome, not elsewhere classified: Secondary | ICD-10-CM | POA: Diagnosis not present

## 2016-02-01 DIAGNOSIS — Z01812 Encounter for preprocedural laboratory examination: Secondary | ICD-10-CM | POA: Diagnosis present

## 2016-02-01 HISTORY — DX: Spontaneous ecchymoses: R23.3

## 2016-02-01 HISTORY — DX: Other skin changes: R23.8

## 2016-02-01 HISTORY — DX: Anxiety disorder, unspecified: F41.9

## 2016-02-01 LAB — PROTIME-INR
INR: 1.12 (ref 0.00–1.49)
Prothrombin Time: 14.6 seconds (ref 11.6–15.2)

## 2016-02-01 LAB — CBC
HCT: 40.1 % (ref 36.0–46.0)
Hemoglobin: 13.4 g/dL (ref 12.0–15.0)
MCH: 28.3 pg (ref 26.0–34.0)
MCHC: 33.4 g/dL (ref 30.0–36.0)
MCV: 84.6 fL (ref 78.0–100.0)
Platelets: 271 10*3/uL (ref 150–400)
RBC: 4.74 MIL/uL (ref 3.87–5.11)
RDW: 11.9 % (ref 11.5–15.5)
WBC: 5.4 10*3/uL (ref 4.0–10.5)

## 2016-02-01 LAB — BASIC METABOLIC PANEL
Anion gap: 10 (ref 5–15)
BUN: 10 mg/dL (ref 6–20)
CO2: 19 mmol/L — ABNORMAL LOW (ref 22–32)
Calcium: 9.2 mg/dL (ref 8.9–10.3)
Chloride: 111 mmol/L (ref 101–111)
Creatinine, Ser: 0.79 mg/dL (ref 0.44–1.00)
GFR calc Af Amer: >60 mL/min (ref >60)
GFR calc non Af Amer: >60 mL/min (ref >60)
Glucose, Bld: 97 mg/dL (ref 65–99)
Potassium: 4.1 mmol/L (ref 3.5–5.1)
Sodium: 140 mmol/L (ref 135–145)

## 2016-02-01 LAB — HCG, SERUM, QUALITATIVE: Preg, Serum: NEGATIVE

## 2016-02-01 LAB — APTT: aPTT: 29 seconds (ref 24–37)

## 2016-02-01 LAB — SURGICAL PCR SCREEN
MRSA, PCR: NEGATIVE
Staphylococcus aureus: POSITIVE — AB

## 2016-02-04 NOTE — H&P (Signed)
Alexis Carrillo is an 35 y.o. female.   Chief Complaint: neck pain, radiation into upper extremities HPI: Alexis Carrillo is a very pleasant female who underwent spinal cord stimulator trial and attempt to control her cervical pain with  non-pharmacological means.  The patient has been trialing multiple interventions over the past years.  She would really prefer not to be utilizing medication therapy to manage her symptoms and has had continued pain despite other interventions.  Unfortunately her job requires her to constantly turn her head back and forth.  This naturally aggravates her cervical pain.  It is difficult to control her pain symptoms with the type of activities she has to perform on a daily basis.  Lateral rotation and bending as well as prolonged periods of cervical flexion or extension will continue to progress the patient's symptoms.   She did very well with the trial.  She reports a 60% improvement in her symptoms.  One of the patient's main goals is to be more active.  She has a young son who she would like to be as active as possible with.  She herself is quite young as well.  Her pain has been very limiting until now.  Even simple things such as blow drying her hair are less painful with the spinal cord stimulator in place.  While he did not remove all of her symptoms did provide her told to utilize during periods of increased pain when she would otherwise require more medication.  She has been able to be more active and has been able to focus better.  These are all improvements in her quality of life.  She reports no side effects with the spinal cord stimulator.  She seems to tolerate it well.    Past Medical History  Diagnosis Date  . Headache(784.0)     migraines  . Fibromyalgia   . Arthritis     knees  . Chronic tonsillitis 09/2012  . Asthma     prn inhaler  . History of empyema of pleura 2007  . History of MRSA infection 2007    face  . Anxiety   . Bruises easily      Past Surgical History  Procedure Laterality Date  . Anterior cervical decomp/discectomy fusion  07/22/2009    C6-7  . Knee arthroscopy w/ acl reconstruction  1998    right  . Cesarean section  2003; 2005  . Chest tube insertion  2007  . Wisdom tooth extraction    . Tonsillectomy  10/01/2012    Procedure: TONSILLECTOMY;  Surgeon: Serena Colonel, MD;  Location: [facility];  Service: ENT;  Laterality: N/A;  . Tonsillectomy      No family history on file. Social History:  reports that she has never smoked. She has never used smokeless tobacco. She reports that she does not drink alcohol or use illicit drugs.  Allergies:  Allergies  Allergen Reactions  . Adhesive [Tape] Itching and Rash    Medications Prior to Admission  Medication Sig Dispense Refill  . albuterol (PROVENTIL HFA;VENTOLIN HFA) 108 (90 BASE) MCG/ACT inhaler Inhale 2 puffs into the lungs every 6 (six) hours as needed.    . fexofenadine (ALLEGRA) 180 MG tablet Take 180 mg by mouth daily as needed for allergies or rhinitis.    Marland Kitchen topiramate (TOPAMAX) 100 MG tablet Take 200 mg by mouth daily.     . traMADol (ULTRAM) 50 MG tablet Take 50-100 mg by mouth every 6 (six) hours as needed  for moderate pain.      No results found for this or any previous visit (from the past 48 hour(s)). No results found.  Review of Systems  Constitutional: Negative.   HENT: Negative for ear discharge, ear pain, hearing loss, nosebleeds, sore throat and tinnitus.   Eyes: Negative.   Respiratory: Negative.  Negative for stridor.   Cardiovascular: Negative.   Gastrointestinal: Negative.   Genitourinary: Negative.   Musculoskeletal: Positive for myalgias and neck pain. Negative for joint pain and falls.  Skin: Negative.   Neurological: Negative.   Endo/Heme/Allergies: Negative.   Psychiatric/Behavioral: Negative.     Blood pressure 117/46, temperature 98.5 F (36.9 C), resp. rate 20, height 5\' 6"  (1.676 m), weight 96.163  kg (212 lb), SpO2 100 %. Physical Exam  Constitutional: She is oriented to person, place, and time. She appears well-developed and well-nourished.  HENT:  Head: Normocephalic and atraumatic.  Eyes: EOM are normal. Pupils are equal, round, and reactive to light.  Cardiovascular: Normal rate.   Respiratory: Effort normal.  Neurological: She is alert and oriented to person, place, and time.  Skin: Skin is warm and dry.  Psychiatric: She has a normal mood and affect. Her behavior is normal. Thought content normal.     Assessment/Plan Cervical post-laminectomy syndrome Chronic neck pain  PLAN: permanent cervical SCS, [employer]  Gwynne EdingerPaul C Emry Tobin, MD 02/05/2016, 7:18 AM

## 2016-02-05 ENCOUNTER — Encounter (HOSPITAL_COMMUNITY)
Admission: RE | Disposition: A | Discharge status: Home / Self Care | Payer: Self-pay | Source: Ambulatory Visit | Attending: Anesthesiology

## 2016-02-05 ENCOUNTER — Ambulatory Visit (HOSPITAL_COMMUNITY): Payer: Federal, State, Local not specified - PPO | Admitting: Certified Registered"

## 2016-02-05 ENCOUNTER — Ambulatory Visit (HOSPITAL_COMMUNITY): Payer: Federal, State, Local not specified - PPO

## 2016-02-05 ENCOUNTER — Ambulatory Visit (HOSPITAL_COMMUNITY)
Admission: RE | Admit: 2016-02-05 | Discharge: 2016-02-05 | Disposition: A | Discharge status: Home / Self Care | Payer: Federal, State, Local not specified - PPO | Source: Ambulatory Visit | Attending: Anesthesiology | Admitting: Anesthesiology

## 2016-02-05 ENCOUNTER — Encounter (HOSPITAL_COMMUNITY): Payer: Self-pay | Admitting: *Deleted

## 2016-02-05 DIAGNOSIS — M797 Fibromyalgia: Secondary | ICD-10-CM | POA: Diagnosis not present

## 2016-02-05 DIAGNOSIS — Z419 Encounter for procedure for purposes other than remedying health state, unspecified: Secondary | ICD-10-CM

## 2016-02-05 DIAGNOSIS — M961 Postlaminectomy syndrome, not elsewhere classified: Secondary | ICD-10-CM | POA: Insufficient documentation

## 2016-02-05 DIAGNOSIS — G8929 Other chronic pain: Secondary | ICD-10-CM | POA: Diagnosis not present

## 2016-02-05 DIAGNOSIS — Z8614 Personal history of Methicillin resistant Staphylococcus aureus infection: Secondary | ICD-10-CM | POA: Diagnosis not present

## 2016-02-05 HISTORY — PX: SPINAL CORD STIMULATOR INSERTION: SHX5378

## 2016-02-05 SURGERY — CERVICAL SPINAL CORD STIMULATOR INSERTION
Anesthesia: Monitor Anesthesia Care

## 2016-02-05 MED ORDER — BUPIVACAINE-EPINEPHRINE (PF) 0.5% -1:200000 IJ SOLN
INTRAMUSCULAR | Status: DC | PRN
  Administered 2016-02-05: 30 mL via PERINEURAL
  Administered 2016-02-05: 17 mL via PERINEURAL

## 2016-02-05 MED ORDER — LACTATED RINGERS IV SOLN
INTRAVENOUS | Status: DC | PRN
  Administered 2016-02-05: 07:00:00 via INTRAVENOUS

## 2016-02-05 MED ORDER — FENTANYL CITRATE (PF) 250 MCG/5ML IJ SOLN
INTRAMUSCULAR | Status: DC | PRN
  Administered 2016-02-05: 50 ug via INTRAVENOUS
  Administered 2016-02-05: 25 ug via INTRAVENOUS

## 2016-02-05 MED ORDER — PROPOFOL 10 MG/ML IV BOLUS
INTRAVENOUS | Status: DC | PRN
  Administered 2016-02-05: 20 mg via INTRAVENOUS

## 2016-02-05 MED ORDER — CLINDAMYCIN HCL 150 MG PO CAPS: 150.0000 mg | ORAL_CAPSULE | Freq: Three times a day (TID) | ORAL | Status: DC

## 2016-02-05 MED ORDER — OXYCODONE HCL 5 MG PO TABS: 5.0000 mg | ORAL_TABLET | Freq: Once | ORAL | Status: DC | PRN

## 2016-02-05 MED ORDER — 0.9 % SODIUM CHLORIDE (POUR BTL) OPTIME
TOPICAL | Status: DC | PRN
  Administered 2016-02-05: 1000 mL

## 2016-02-05 MED ORDER — CEFAZOLIN SODIUM-DEXTROSE 2-4 GM/100ML-% IV SOLN
2.0000 g | INTRAVENOUS | Status: AC
  Administered 2016-02-05: 2 g via INTRAVENOUS

## 2016-02-05 MED ORDER — MIDAZOLAM HCL 2 MG/2ML IJ SOLN
INTRAMUSCULAR | Status: AC
  Filled 2016-02-05: qty 2

## 2016-02-05 MED ORDER — HYDROCODONE-ACETAMINOPHEN 10-325 MG PO TABS: 1.0000 | ORAL_TABLET | ORAL | Status: DC | PRN

## 2016-02-05 MED ORDER — OXYCODONE HCL 5 MG/5ML PO SOLN: 5.0000 mg | Freq: Once | ORAL | Status: DC | PRN

## 2016-02-05 MED ORDER — HYDROMORPHONE HCL 1 MG/ML IJ SOLN: 0.2500 mg | INTRAMUSCULAR | Status: DC | PRN

## 2016-02-05 MED ORDER — LIDOCAINE HCL (CARDIAC) 20 MG/ML IV SOLN
INTRAVENOUS | Status: DC | PRN
  Administered 2016-02-05: 20 mg via INTRAVENOUS

## 2016-02-05 MED ORDER — SODIUM CHLORIDE 0.9 % IR SOLN
Status: DC | PRN
  Administered 2016-02-05: 08:00:00

## 2016-02-05 MED ORDER — ONDANSETRON HCL 4 MG/2ML IJ SOLN
4.0000 mg | Freq: Once | INTRAMUSCULAR | Status: DC | PRN
Start: 2016-02-05 — End: 2016-02-05

## 2016-02-05 MED ORDER — MIDAZOLAM HCL 5 MG/5ML IJ SOLN
INTRAMUSCULAR | Status: DC | PRN
  Administered 2016-02-05: 2 mg via INTRAVENOUS

## 2016-02-05 MED ORDER — FENTANYL CITRATE (PF) 250 MCG/5ML IJ SOLN
INTRAMUSCULAR | Status: AC
  Filled 2016-02-05: qty 5

## 2016-02-05 MED ORDER — PROPOFOL 500 MG/50ML IV EMUL
INTRAVENOUS | Status: DC | PRN
Start: 2016-02-05 — End: 2016-02-05
  Administered 2016-02-05: 75 ug/kg/min via INTRAVENOUS

## 2016-02-05 MED ORDER — CEFAZOLIN SODIUM-DEXTROSE 2-4 GM/100ML-% IV SOLN
INTRAVENOUS | Status: AC
  Filled 2016-02-05: qty 100

## 2016-02-05 SURGICAL SUPPLY — 53 items
ANCHOR CLIK X NEURO (Stimulator) ×2 IMPLANT
BAG DECANTER FOR FLEXI CONT (MISCELLANEOUS) ×2 IMPLANT
BINDER ABDOMINAL 12 ML 46-62 (SOFTGOODS) ×2 IMPLANT
CABLE/EXTENSION OR 1X16 61 (CABLE) ×4 IMPLANT
CHLORAPREP W/TINT 26ML (MISCELLANEOUS) ×2 IMPLANT
DRAPE C-ARM 42X72 X-RAY (DRAPES) ×4 IMPLANT
DRAPE C-ARMOR (DRAPES) ×2 IMPLANT
DRAPE LAPAROTOMY 100X72X124 (DRAPES) ×2 IMPLANT
DRAPE POUCH INSTRU U-SHP 10X18 (DRAPES) ×2 IMPLANT
DRAPE SURG 17X23 STRL (DRAPES) ×2 IMPLANT
DRSG OPSITE POSTOP 4X6 (GAUZE/BANDAGES/DRESSINGS) ×4 IMPLANT
ELECT REM PT RETURN 9FT ADLT (ELECTROSURGICAL) ×2
ELECTRODE REM PT RTRN 9FT ADLT (ELECTROSURGICAL) ×1 IMPLANT
GAUZE SPONGE 4X4 16PLY XRAY LF (GAUZE/BANDAGES/DRESSINGS) IMPLANT
GLOVE BIOGEL PI IND STRL 7.5 (GLOVE) ×1 IMPLANT
GLOVE BIOGEL PI INDICATOR 7.5 (GLOVE) ×1
GLOVE ECLIPSE 7.5 STRL STRAW (GLOVE) ×2 IMPLANT
GLOVE EXAM NITRILE LRG STRL (GLOVE) IMPLANT
GLOVE EXAM NITRILE MD LF STRL (GLOVE) IMPLANT
GLOVE EXAM NITRILE XL STR (GLOVE) IMPLANT
GLOVE EXAM NITRILE XS STR PU (GLOVE) IMPLANT
GOWN STRL REUS W/ TWL LRG LVL3 (GOWN DISPOSABLE) ×1 IMPLANT
GOWN STRL REUS W/ TWL XL LVL3 (GOWN DISPOSABLE) IMPLANT
GOWN STRL REUS W/TWL 2XL LVL3 (GOWN DISPOSABLE) ×2 IMPLANT
GOWN STRL REUS W/TWL LRG LVL3 (GOWN DISPOSABLE) ×1
GOWN STRL REUS W/TWL XL LVL3 (GOWN DISPOSABLE)
IPG PRECISION SPECTRA (Stimulator) ×2 IMPLANT
KIT BASIN OR (CUSTOM PROCEDURE TRAY) ×2 IMPLANT
KIT CHARGING (KITS) ×1
KIT CHARGING PRECISION NEURO (KITS) ×1 IMPLANT
KIT LEAD INFINION (Lead) ×4 IMPLANT
KIT PAT PROGRAM FREELINK (KITS) ×1 IMPLANT
KIT ROOM TURNOVER OR (KITS) ×2 IMPLANT
KIT SPLITTER 30CM 2X8 (Stimulator) ×4 IMPLANT
LIQUID BAND (GAUZE/BANDAGES/DRESSINGS) ×2 IMPLANT
NEEDLE 18GX1X1/2 (RX/OR ONLY) (NEEDLE) IMPLANT
NEEDLE HYPO 25X1 1.5 SAFETY (NEEDLE) ×2 IMPLANT
NS IRRIG 1000ML POUR BTL (IV SOLUTION) ×2 IMPLANT
PACK LAMINECTOMY NEURO (CUSTOM PROCEDURE TRAY) ×2 IMPLANT
PAD ARMBOARD 7.5X6 YLW CONV (MISCELLANEOUS) ×2 IMPLANT
REMOTE CONTROL KIT (KITS) ×2
SPONGE LAP 4X18 X RAY DECT (DISPOSABLE) IMPLANT
STAPLER SKIN PROX WIDE 3.9 (STAPLE) ×2 IMPLANT
SUT MNCRL AB 3-0 PS2 18 (SUTURE) ×2 IMPLANT
SUT SILK 0 (SUTURE) ×1
SUT SILK 0 MO-6 18XCR BRD 8 (SUTURE) ×1 IMPLANT
SUT SILK 2 0 TIES 10X30 (SUTURE) IMPLANT
SUT VIC AB 2-0 CP2 18 (SUTURE) ×2 IMPLANT
SYRINGE 10CC LL (SYRINGE) IMPLANT
TOWEL OR 17X24 6PK STRL BLUE (TOWEL DISPOSABLE) ×2 IMPLANT
TOWEL OR 17X26 10 PK STRL BLUE (TOWEL DISPOSABLE) ×2 IMPLANT
WATER STERILE IRR 1000ML POUR (IV SOLUTION) ×2 IMPLANT
YANKAUER SUCT BULB TIP NO VENT (SUCTIONS) ×2 IMPLANT

## 2016-02-05 NOTE — Anesthesia Preprocedure Evaluation (Signed)
Anesthesia Evaluation  Patient identified by MRN, date of birth, ID band Patient awake    Reviewed: Allergy & Precautions, NPO status , Patient's Chart, lab work & pertinent test results  Airway Mallampati: II  TM Distance: >3 FB Neck ROM: Full    Dental  (+) Teeth Intact, Dental Advisory Given   Pulmonary    breath sounds clear to auscultation       Cardiovascular  Rhythm:Regular Rate:Normal     Neuro/Psych    GI/Hepatic   Endo/Other    Renal/GU      Musculoskeletal   Abdominal   Peds  Hematology   Anesthesia Other Findings   Reproductive/Obstetrics                            Anesthesia Physical Anesthesia Plan  ASA: II  Anesthesia Plan: MAC   Post-op Pain Management:    Induction: Intravenous  Airway Management Planned: Natural Airway and Simple Face Mask  Additional Equipment:   Intra-op Plan:   Post-operative Plan:   Informed Consent: I have reviewed the patients History and Physical, chart, labs and discussed the procedure including the risks, benefits and alternatives for the proposed anesthesia with the patient or authorized representative who has indicated his/her understanding and acceptance.   Dental advisory given  Plan Discussed with: CRNA and Anesthesiologist  Anesthesia Plan Comments:        Anesthesia Quick Evaluation  

## 2016-02-05 NOTE — Op Note (Signed)
PREOP DX: 1) cervical post-laminectomy syndrome 2) chronic pain   POSTOP DX: same as preop  PROCEDURES PERFORMED: 1) implantation of 2 70 cm 16 contact [employer] SCS leads 2) implantation of [employer] Spectra IPG 3) I/O fluoro with interpretation  SURGEON: Kelsie Kramp  ASSISTANT: none  ANESTHESIA: MAC/TIVA  EBL:<6350mL  DESCRIPTION OF PROCEDURE: After a discussion of risks, benefits and alternatives, written informed consent was obtained. The patient was taken to the operative suite where he was placed on the table in the prone position with chest rolls and a head/face cushion. A timeout was taken, identifying the patient, procedure, personnel, equipment, antibiotic administration and staff concerns (of which there were none).   The cervical, thoracic and lumbar spine were widely prepped with duraprep, and draped into a sterile field. Fluoroscopy was utilized to plan a right paramedian placement of leads into the T11-12 space and then after anesthetizing the skin and subcutaneous tissue with 0.25% bupivicaine 1:200K epinephrine, an incision was made and carried down to the dorsal fascia. 14g Touhy needles were used to access the intended space using biplanar fluoroscopy and loss-of-resistance technique, and two 70 cm leads were manipulated into the cervical epidural space under live fluoroscopy so that the distal tips were at the C4 vertebral body shadow, and each lead rested just left and right of the spinous process. The patient was aroused, and the leads tested to verify good coverage. The patient reported good coverage into both upper extremities along the complete distribution of the leads.   With good coverage established, the needles and stylets were backed off the leads, with fluoro verifying no migration of the leads from their tested positions. 0 silk sutures were placed into the fascia, tied, and then used to fix the leads to the fascia using chest-tube type fixation.  Hemaclips were placed at the fascial penetration of each lead as radiologic markers  Attention was turned to creation of a subcutaneous pocket in the patients right flank. An incision was planned, the skin and subcutaneous tissues anesthetized with 0.25% bupivicaine 1:200K epinephrine, and then an incision made and a pocket developed using blunt dissection and the Bovie electrocautery. Hemostasis was assured in the pocket, and then the leads passed into the pocket using a reverse Seldinger technique. The leads were meticulously cleaned, and inserted into the generator. Impedances were checked and found to be good in each contact; the leads were then fixed into the generator using a self torquing wrench. Impedances were rechecked and found to be good in each contact.  Each incision was copiously irrigated with over 800 mL of bacitracin-containing irrigant. The lumbar incision was closed with 2 layers of interrupted 2-0 vicryl sutures and the skin closed with staples. The pocket incision was closed with a deep layer of 2-0 interrupted vicryl sutures, and the skin closed with staples. Sterile dressings were applied and the patient taken to the PACU. Needle, instrument and sponge counts were correct x2 at the end of the case.  COMPLICATIONS: NONE  CONDITION: stable throughout the course of the procedure and immediately afterward to PACU  DISPOSITION: Discharge home. Pain medicine and prophylactic antibiotics. May remove dressings and shower on post-op day #3 No submerging. Case and care discussed with family members.

## 2016-02-05 NOTE — Anesthesia Postprocedure Evaluation (Signed)
Anesthesia Post Note  Patient: Alexis Carrillo  Procedure(s) Performed: Procedure(s) (LRB): CERVICAL SPINAL CORD STIMULATOR INSERTION (N/A)  Patient location during evaluation: PACU Anesthesia Type: MAC Level of consciousness: awake, awake and alert and oriented Pain management: pain level controlled Vital Signs Assessment: post-procedure vital signs reviewed and stable Respiratory status: spontaneous breathing, nonlabored ventilation and respiratory function stable Cardiovascular status: blood pressure returned to baseline Anesthetic complications: no    Last Vitals:  Filed Vitals:   02/05/16 0940 02/05/16 1005  BP: 106/55 104/63  Pulse: 62 53  Temp:    Resp: 14     Last Pain:  Filed Vitals:   02/05/16 1006  PainSc: 0-No pain                 Taksh Hjort COKER

## 2016-02-05 NOTE — Transfer of Care (Signed)
Immediate Anesthesia Transfer of Care Note  Patient: Alexis Carrillo  Procedure(s) Performed: Procedure(s): CERVICAL SPINAL CORD STIMULATOR INSERTION (N/A)  Patient Location: PACU  Anesthesia Type:MAC  Level of Consciousness: awake, alert , oriented and sedated  Airway & Oxygen Therapy: Patient Spontanous Breathing and Patient connected to face mask oxygen  Post-op Assessment: Report given to RN, Post -op Vital signs reviewed and stable and Patient moving all extremities X 4  Post vital signs: Reviewed  Last Vitals:  Filed Vitals:   02/05/16 0614  BP: 117/46  Temp: 36.9 C  Resp: 20    Last Pain:  Filed Vitals:   02/05/16 0615  PainSc: 6       Patients Stated Pain Goal: 3 (02/05/16 16100609)  Complications: No apparent anesthesia complications

## 2016-02-08 ENCOUNTER — Encounter (HOSPITAL_COMMUNITY): Payer: Self-pay | Admitting: Anesthesiology

## 2016-10-17 IMAGING — CT CT ABD-PELV W/O CM
2 of 4 series · 13 of 36 positions shown, 19 images · non-contrast
Comparison: None.

CLINICAL DATA: RIGHT flank and RIGHT upper quadrant pain for 2
weeks, protein and crystals in urine, question kidney stones

EXAM:
CT ABDOMEN AND PELVIS WITHOUT CONTRAST
TECHNIQUE: Multidetector CT imaging of the abdomen and pelvis was performed
following the standard protocol without IV contrast. Sagittal and
coronal MPR images reconstructed from axial data set.

[Series 601: coronal body · coronal · 0.92mm/px · 1 of 109 slices shown, 2 images]
[im 37/109  soft-tissue]
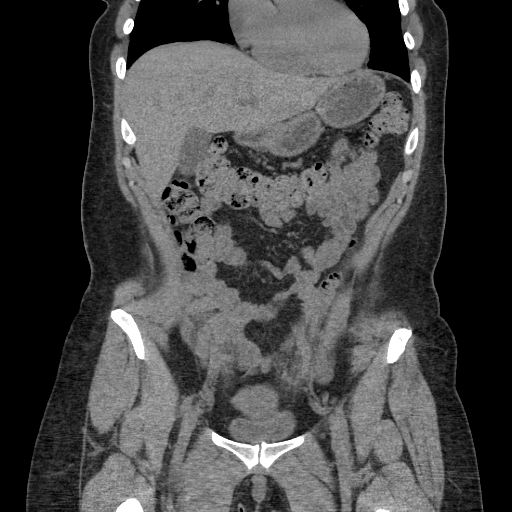
[im 37/109  bone]
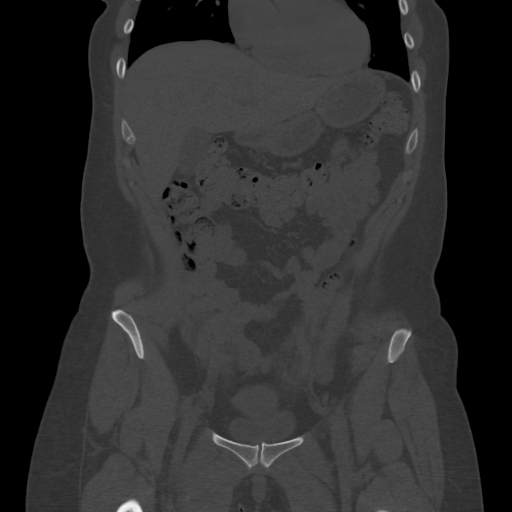

[Series 602: sagittal body · sagittal · 0.92mm/px · 12 of 176 slices shown, 17 images]
[im 10/176  soft-tissue]
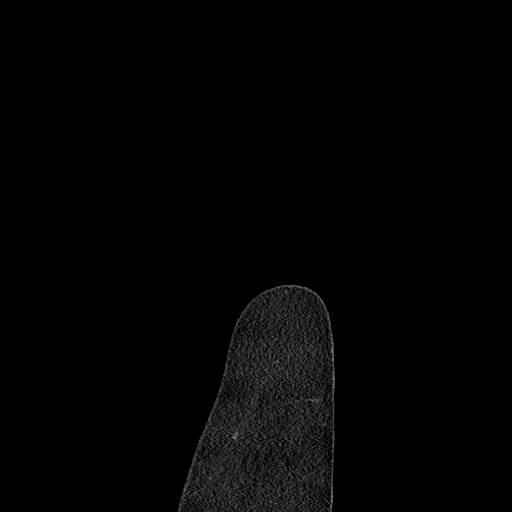
[im 10/176  lung]
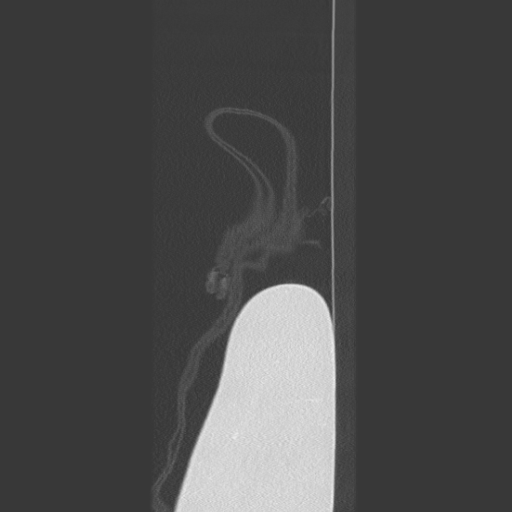
[im 10/176  bone]
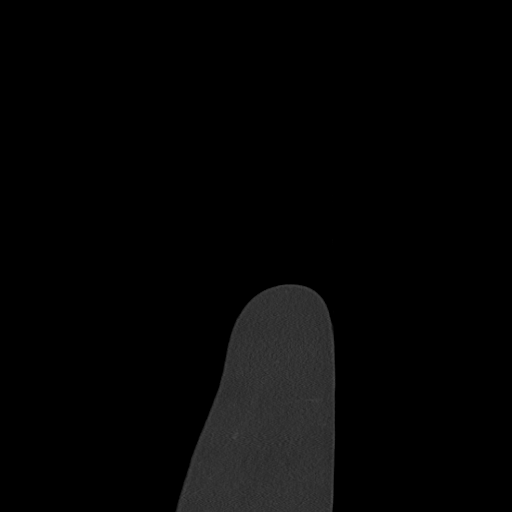
[im 20/176  lung]
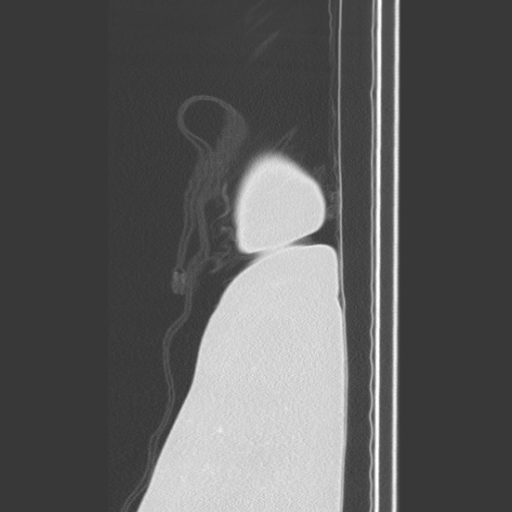
[im 30/176  soft-tissue]
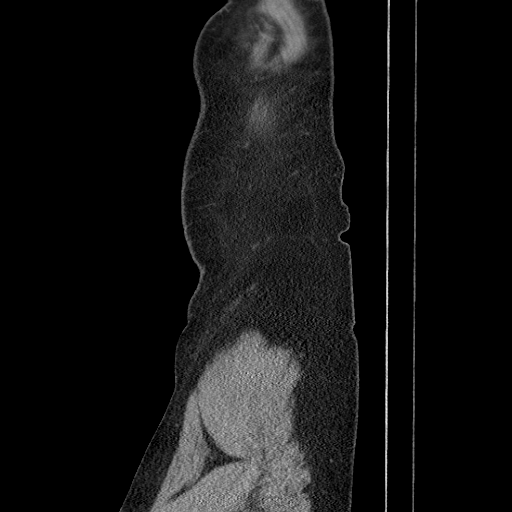
[im 30/176  lung]
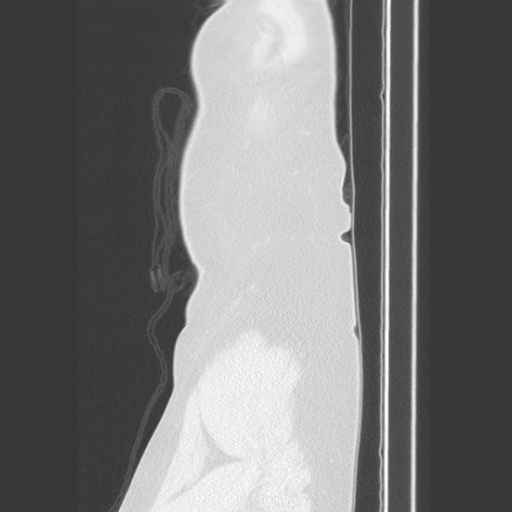
[im 39/176  soft-tissue]
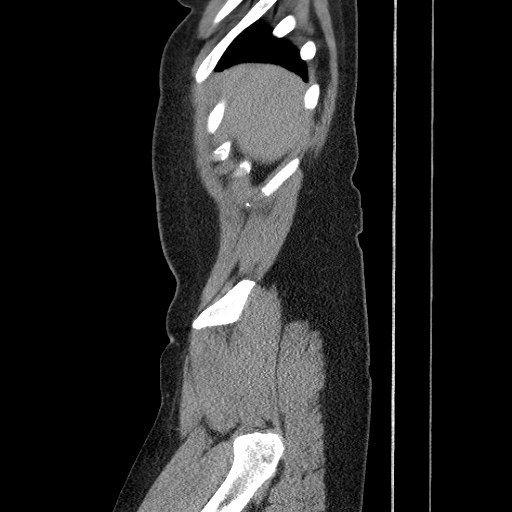
[im 39/176  lung]
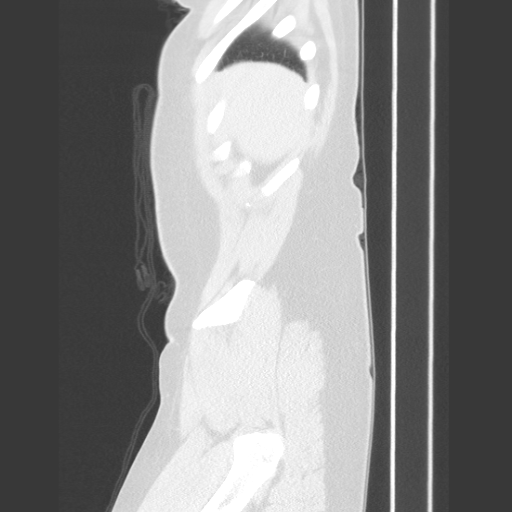
[im 59/176  soft-tissue]
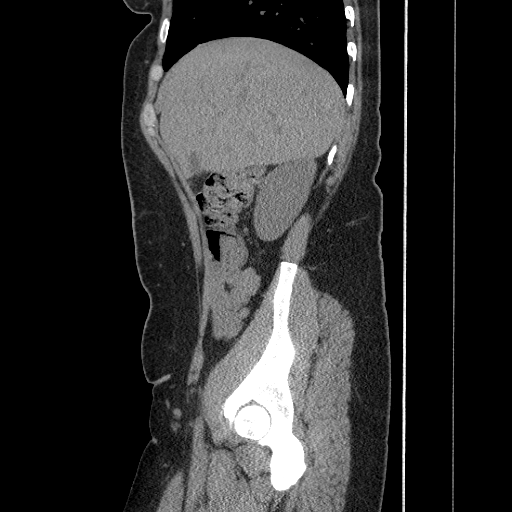
[im 69/176  soft-tissue]
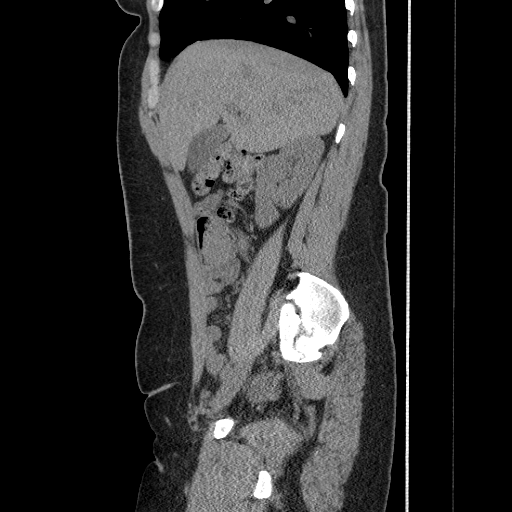
[im 88/176  soft-tissue]
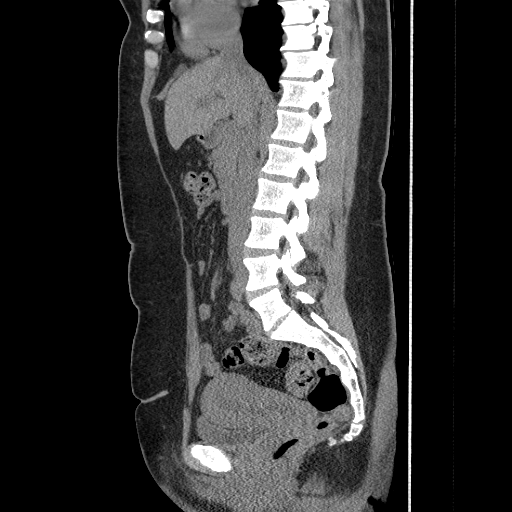
[im 107/176  soft-tissue]
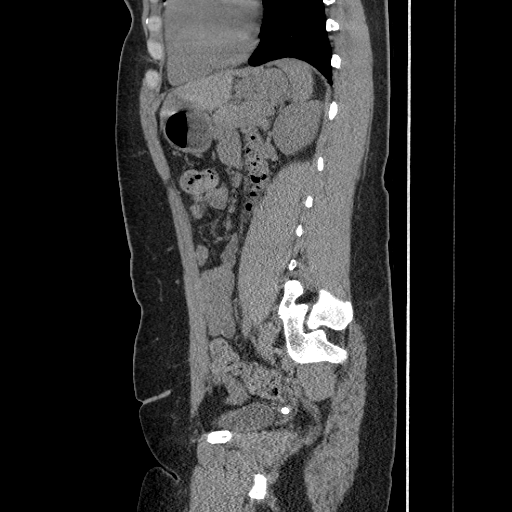
[im 117/176  soft-tissue]
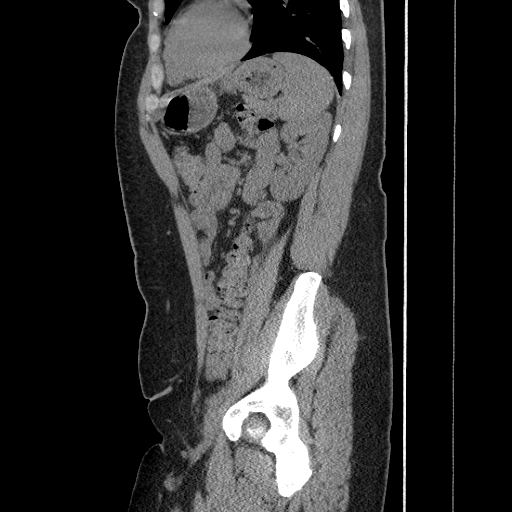
[im 137/176  soft-tissue]
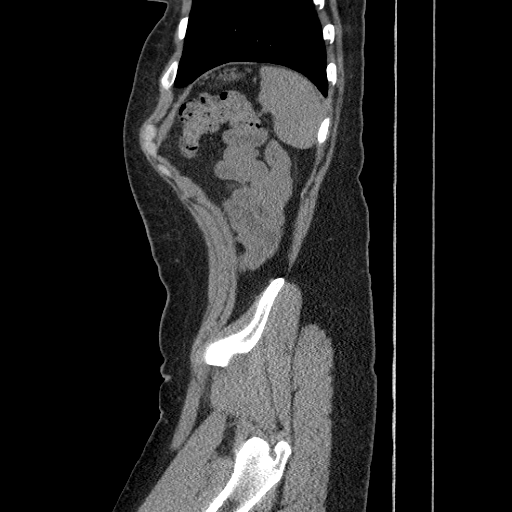
[im 137/176  bone]
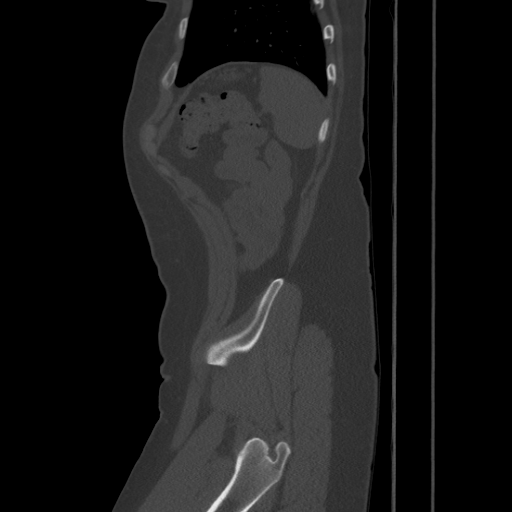
[im 146/176  soft-tissue]
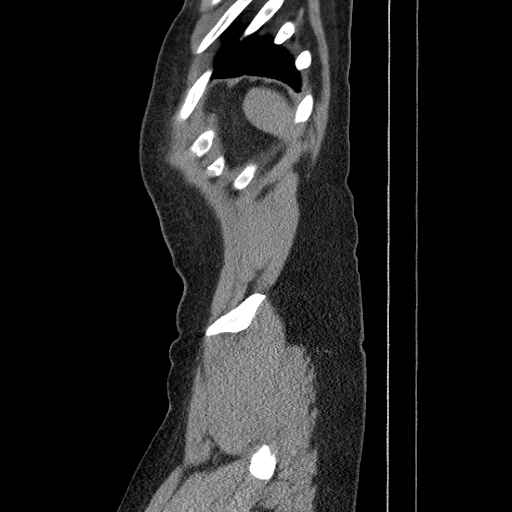
[im 166/176  soft-tissue]
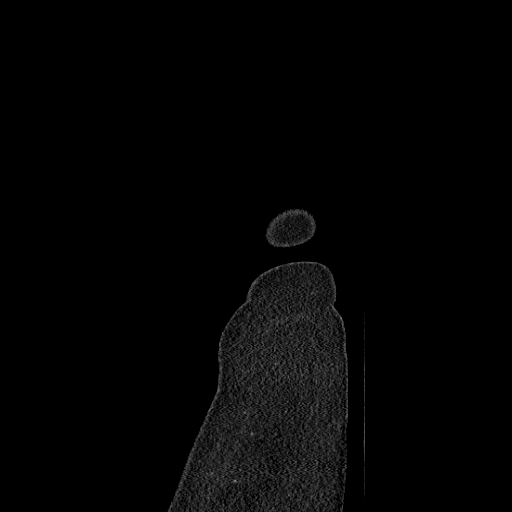

[13 of 36 positions shown; findings below may reference images not displayed]

FINDINGS: Lung bases clear.

Normal noncontrast appearance of kidneys without mass or
hydronephrosis.

No urinary tract calcification or dilatation.

Within limits of a nonenhanced exam no focal abnormalities of the
liver, gallbladder, spleen, pancreas, kidneys, or adrenal glands.

Upper normal caliber appendix containing air without wall thickening
or inflammatory changes.

Unremarkable bladder, ureters, uterus and adnexa.

Scattered pelvic phleboliths.

Stomach and bowel loops unremarkable for technique.

No mass, adenopathy, free fluid, free air, or inflammatory process.

Osseous structures unremarkable.
IMPRESSION: No acute intra-abdominal or intrapelvic abnormalities.

## 2017-12-06 IMAGING — RF DG C-ARM 61-120 MIN
1 series · 1 of 1 positions shown · non-contrast
Comparison: MRI 09/30/2015

CLINICAL DATA: Spinal cord stimulator insertion

EXAM:
CERVICAL SPINE 1 VIEW

[Series 1: run · 1 of 1 slices shown]
[im 1/1]
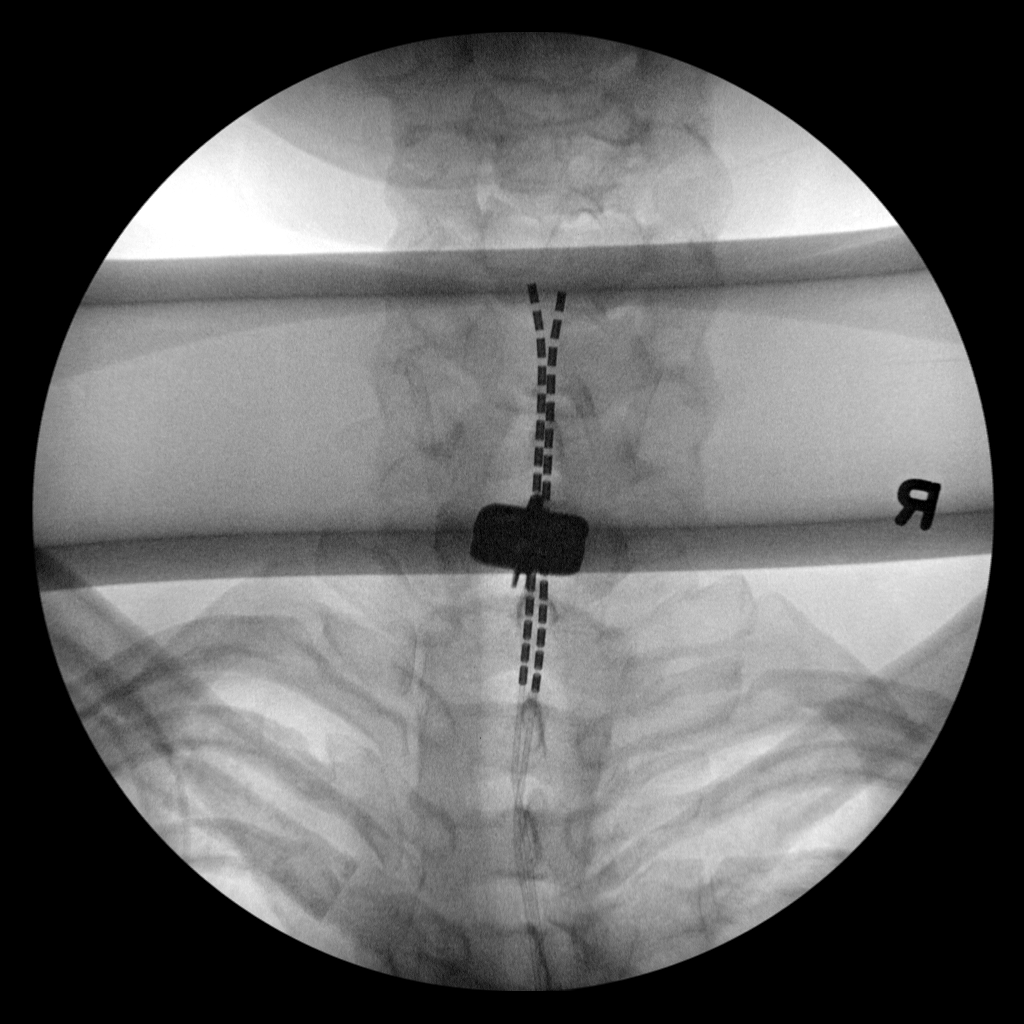

[1 of 1 positions shown; findings below may reference images not displayed]

FINDINGS: Single AP intraoperative image of the cervical spine demonstrates
spinal stimulator wires projecting over the midline in the mid
cervical spine region.
IMPRESSION: Spinal stimulator placement as above.

## 2021-03-08 ENCOUNTER — Other Ambulatory Visit: Payer: Self-pay

## 2021-03-08 ENCOUNTER — Ambulatory Visit (INDEPENDENT_AMBULATORY_CARE_PROVIDER_SITE_OTHER): Payer: Federal, State, Local not specified - PPO

## 2021-03-08 ENCOUNTER — Encounter: Payer: Self-pay | Admitting: Podiatry

## 2021-03-08 ENCOUNTER — Ambulatory Visit: Payer: Federal, State, Local not specified - PPO | Admitting: Podiatry

## 2021-03-08 DIAGNOSIS — M7730 Calcaneal spur, unspecified foot: Secondary | ICD-10-CM

## 2021-03-08 DIAGNOSIS — M722 Plantar fascial fibromatosis: Secondary | ICD-10-CM

## 2021-03-08 NOTE — Progress Notes (Signed)
  Subjective:  Patient ID: Alexis Carrillo, female    DOB: 08-27-1981,  MRN: [id number]  Chief Complaint  Patient presents with  . Plantar Fasciitis    (xray)(NP) bilateral foot pain-heel, right foot worse    40 y.o. female presents with the above complaint. History confirmed with patient. She has a chronic plantar fasciitis for many years, she was treated in 2014 and 2015 by Dr. Charlsie Merles.  She was treated with orthotics, physical therapy, injections and anti-inflammatories.  These all helped somewhat but never really fully eliminated the pain.  Is slowly come back.  She is tired of dealing with this and like to explore other options.  Objective:  Physical Exam: warm, good capillary refill, no trophic changes or ulcerative lesions, normal DP and PT pulses and normal sensory exam.  Bilateral she has sharp pain on palpation to the insertion of the plantar calcaneal tubercle of the medial band of the plantar fascial.  No posterior pain.  No gastrocnemius equinus.  Radiographs: Previous bilateral foot radiographs reviewed she has a plantar calcaneal spur at the insertion of the plantar fascia Assessment:   1. Plantar fasciitis, bilateral      Plan:  Patient was evaluated and treated and all questions answered.  Discussed the etiology and treatment options for plantar fasciitis including stretching, formal physical therapy, supportive shoegears such as a running shoe or sneaker, pre fabricated orthoses, injection therapy, and oral medications. We also discussed the role of surgical treatment of this for patients who do not improve after exhausting non-surgical treatment options.  At this point she falls in the latter category and has had this problem for several years.  It has not gotten better and she would like to explore other options.  We discussed both further treatment both surgically and nonsurgically.  Nonsurgical we discussed EPAT shockwave therapy as well as platelet rich plasma  injections.  She feels that this would be cost prohibitive for her.  We also discussed plantar fasciotomy.  I recommend MRIs to evaluate for bone marrow edema within the bone spur itself to see if bone spur resection is necessary.  She does have a spinal cord stimulator that was implanted in 2017.  I will go and order the MRIs and see if it would be compatible with MRI at [facility] imaging.  If we need to move to the hospital for the MRI for interrogation we can do that as well.  In the event that the stimulator is not MRI compatible then we will proceed with fasciotomy and if not improving plan for bone spur resection at a later date which she is willing to do.  Follow-up after MRI     Return in about 4 weeks (around 04/05/2021) for recheck plantar fasciitis, after MRI to review.

## 2021-03-15 ENCOUNTER — Telehealth: Payer: Self-pay | Admitting: *Deleted

## 2021-03-15 NOTE — Telephone Encounter (Signed)
Patient is calling for status of what was discussed during office visit, having an MRI with a spinal cord stimulator. Returned call to patient to get more information,stated that they had just called her concerning the MRI. Will keep [place] updated?

## 2021-03-18 ENCOUNTER — Telehealth: Payer: Self-pay | Admitting: *Deleted

## 2021-03-18 NOTE — Telephone Encounter (Signed)
Patient wanted to let the doctor know that she is unable to have MRI,(only of the head).   Could she proceed forward with the surgery to remove the bone spur?Please advise.

## 2021-03-18 NOTE — Telephone Encounter (Signed)
I would plan to go ahead and do a plantar fascia release (surgery) without removing the bone spur, it's a quicker recovery and the spur removal in most cases is not necessary. Can move up her next appt to first available with me to plan if she'd like

## 2021-03-19 NOTE — Telephone Encounter (Signed)
Returned call to patient and gave instructions and recommendations per Dr Lilian Kapur and that we will schedule first available consultation appointment.  She verbalized understanding.

## 2021-03-23 ENCOUNTER — Other Ambulatory Visit: Payer: Self-pay

## 2021-03-23 ENCOUNTER — Ambulatory Visit: Payer: Federal, State, Local not specified - PPO | Admitting: Podiatry

## 2021-03-23 DIAGNOSIS — M722 Plantar fascial fibromatosis: Secondary | ICD-10-CM | POA: Diagnosis not present

## 2021-03-23 DIAGNOSIS — M7730 Calcaneal spur, unspecified foot: Secondary | ICD-10-CM | POA: Diagnosis not present

## 2021-03-25 NOTE — Progress Notes (Signed)
  Subjective:  Patient ID: Alexis Carrillo, female    DOB: [birth year],  MRN: [id number]  Chief Complaint  Patient presents with   Plantar Fasciitis    Surgical consult for P.F mentioned she is unable to get MRI done due to "SES"- further evaluation     40 y.o. female returns for follow-up with the above complaint. History confirmed with patient.  She was not able to complete the MRI due to her spinal cord stimulator that was not compatible   Objective:  Physical Exam: warm, good capillary refill, no trophic changes or ulcerative lesions, normal DP and PT pulses and normal sensory exam.  Bilateral she has sharp pain on palpation to the insertion of the plantar calcaneal tubercle of the medial band of the plantar fascial.  No posterior pain.  No gastrocnemius equinus.  Radiographs: Previous bilateral foot radiographs reviewed she has a plantar calcaneal spur at the insertion of the plantar fascia Assessment:   1. Plantar fasciitis, bilateral   2. Calcaneal spur, unspecified laterality      Plan:  Patient was evaluated and treated and all questions answered.  Unfortunately were not able to get the MRI.  Discussed further treatment and proceeding with surgical intervention.  I recommend we proceed with endoscopic plantar fasciotomy and PRP injection, if she has persistent pain afterwards could consider excision of the bone spurs but I think these will resolve on their own.  Discussed the risk, benefits and potential complications of surgery including but not limited to pain, swelling, infection, scar, numbness which may be temporary or permanent, chronic pain, stiffness, nerve pain or damage, wound healing problems.  Informed sent was signed and reviewed.  All questions were addressed.   Surgical plan:  Procedure: -EPF with PRP injection right foot   Location: -[facility]  Anesthesia plan: -IV sedation with local  Postoperative pain plan: - Tylenol 1000 mg every 6 hours, ibuprofen  600 mg every 6 hours, gabapentin 300 mg every 8 hours x5 days, oxycodone 5 mg 1-2 tabs every 6 hours only as needed  DVT prophylaxis: -N/A  WB Restrictions / DME needs: -CAM boot dispensed today     No follow-ups on file.

## 2021-03-26 ENCOUNTER — Telehealth: Payer: Self-pay | Admitting: Urology

## 2021-03-26 NOTE — Telephone Encounter (Signed)
DOS - 04/16/21   EPF RIGHT --- [id number] PLATELET RICH PLASMA INJECTION RIGHT --- [id number]   [employer] EFFECTIVE DATE - 05/20/06   PLAN DEDUCTIBLE - $0.00 OUT OF POCKET - $6,500.00 W/ $6,460.00 REMAINING COINSURANCE - 0% COPAY - $0.00   NO PRIOR AUTH REQUIRED

## 2021-04-13 ENCOUNTER — Ambulatory Visit: Payer: Federal, State, Local not specified - PPO | Admitting: Podiatry

## 2021-04-14 DIAGNOSIS — M79676 Pain in unspecified toe(s): Secondary | ICD-10-CM

## 2021-04-16 ENCOUNTER — Other Ambulatory Visit: Payer: Self-pay | Admitting: Podiatry

## 2021-04-16 DIAGNOSIS — M722 Plantar fascial fibromatosis: Secondary | ICD-10-CM

## 2021-04-16 MED ORDER — OXYCODONE HCL 5 MG PO TABS: 5.0000 mg | ORAL_TABLET | Freq: Four times a day (QID) | ORAL | 0 refills | Status: DC | PRN

## 2021-04-16 MED ORDER — ACETAMINOPHEN 500 MG PO TABS: 1000.0000 mg | ORAL_TABLET | Freq: Four times a day (QID) | ORAL | 0 refills | Status: AC | PRN

## 2021-04-16 MED ORDER — OXYCODONE HCL 5 MG PO TABS: 5.0000 mg | ORAL_TABLET | Freq: Four times a day (QID) | ORAL | 0 refills | Status: AC | PRN

## 2021-04-16 MED ORDER — ACETAMINOPHEN 500 MG PO TABS: 1000.0000 mg | ORAL_TABLET | Freq: Four times a day (QID) | ORAL | 0 refills | Status: DC | PRN

## 2021-04-22 ENCOUNTER — Other Ambulatory Visit: Payer: Self-pay

## 2021-04-22 ENCOUNTER — Ambulatory Visit (INDEPENDENT_AMBULATORY_CARE_PROVIDER_SITE_OTHER): Payer: Federal, State, Local not specified - PPO | Admitting: Podiatry

## 2021-04-22 DIAGNOSIS — M722 Plantar fascial fibromatosis: Secondary | ICD-10-CM

## 2021-04-22 NOTE — Patient Instructions (Signed)
You may walk more in the boot  You may shower now, use the ACE wrap but you do not need a bandage  In 1 week you may begin transitioning to a supportive shoe like a sneaker. If it is still too painful you can keep using the boot  Begin doing the rehab exercises below next week. Do not worry about doing weightbearing exercises or if they hurt too much when you do it     EXERCISES- RANGE OF MOTION (ROM) AND STRETCHING EXERCISES - Plantar Fasciitis (Heel Spur Syndrome) These exercises may help you when beginning to rehabilitate your injury. Your symptoms may resolve with or without further involvement from your physician, physical therapist or athletic trainer. While completing these exercises, remember:  Restoring tissue flexibility helps normal motion to return to the joints. This allows healthier, less painful movement and activity. An effective stretch should be held for at least 30 seconds. A stretch should never be painful. You should only feel a gentle lengthening or release in the stretched tissue.  RANGE OF MOTION - Toe Extension, Flexion Sit with your right / left leg crossed over your opposite knee. Grasp your toes and gently pull them back toward the top of your foot. You should feel a stretch on the bottom of your toes and/or foot. Hold this stretch for 10 seconds. Now, gently pull your toes toward the bottom of your foot. You should feel a stretch on the top of your toes and or foot. Hold this stretch for 10 seconds. Repeat  times. Complete this stretch 3 times per day.   RANGE OF MOTION - Ankle Dorsiflexion, Active Assisted Remove shoes and sit on a chair that is preferably not on a carpeted surface. Place right / left foot under knee. Extend your opposite leg for support. Keeping your heel down, slide your right / left foot back toward the chair until you feel a stretch at your ankle or calf. If you do not feel a stretch, slide your bottom forward to the edge of the chair,  while still keeping your heel down. Hold this stretch for 10 seconds. Repeat 3 times. Complete this stretch 2 times per day.   STRETCH  Gastroc, Standing Place hands on wall. Extend right / left leg, keeping the front knee somewhat bent. Slightly point your toes inward on your back foot. Keeping your right / left heel on the floor and your knee straight, shift your weight toward the wall, not allowing your back to arch. You should feel a gentle stretch in the right / left calf. Hold this position for 10 seconds. Repeat 3 times. Complete this stretch 2 times per day.  STRETCH  Soleus, Standing Place hands on wall. Extend right / left leg, keeping the other knee somewhat bent. Slightly point your toes inward on your back foot. Keep your right / left heel on the floor, bend your back knee, and slightly shift your weight over the back leg so that you feel a gentle stretch deep in your back calf. Hold this position for 10 seconds. Repeat 3 times. Complete this stretch 2 times per day.  STRETCH  Gastrocsoleus, Standing  Note: This exercise can place a lot of stress on your foot and ankle. Please complete this exercise only if specifically instructed by your caregiver.  Place the ball of your right / left foot on a step, keeping your other foot firmly on the same step. Hold on to the wall or a rail for balance. Slowly lift  your other foot, allowing your body weight to press your heel down over the edge of the step. You should feel a stretch in your right / left calf. Hold this position for 10 seconds. Repeat this exercise with a slight bend in your right / left knee. Repeat 3 times. Complete this stretch 2 times per day.   STRENGTHENING EXERCISES - Plantar Fasciitis (Heel Spur Syndrome)  These exercises may help you when beginning to rehabilitate your injury. They may resolve your symptoms with or without further involvement from your physician, physical therapist or athletic trainer. While  completing these exercises, remember:  Muscles can gain both the endurance and the strength needed for everyday activities through controlled exercises. Complete these exercises as instructed by your physician, physical therapist or athletic trainer. Progress the resistance and repetitions only as guided.  STRENGTH - Towel Curls Sit in a chair positioned on a non-carpeted surface. Place your foot on a towel, keeping your heel on the floor. Pull the towel toward your heel by only curling your toes. Keep your heel on the floor. Repeat 3 times. Complete this exercise 2 times per day.  STRENGTH - Ankle Inversion Secure one end of a rubber exercise band/tubing to a fixed object (table, pole). Loop the other end around your foot just before your toes. Place your fists between your knees. This will focus your strengthening at your ankle. Slowly, pull your big toe up and in, making sure the band/tubing is positioned to resist the entire motion. Hold this position for 10 seconds. Have your muscles resist the band/tubing as it slowly pulls your foot back to the starting position. Repeat 3 times. Complete this exercises 2 times per day.  Document Released: 09/19/2005 Document Revised: 12/12/2011 Document Reviewed: 01/01/2009 [facility] Patient Information 2014 [place], [place].

## 2021-04-26 NOTE — Progress Notes (Signed)
  Subjective:  Patient ID: Alexis Carrillo, female    DOB: [birth year],  MRN: [id number]  Chief Complaint  Patient presents with   Routine Post Op    POV #1 DOS 04/16/2021 EPF RT, PLATELET RICH PLASMA INJECTION RT    DOS: 04/16/2021 Procedure: Right foot EPF and platelet rich plasma injection  40 y.o. female returns for post-op check.  Doing well not having much pain has been in the boot  Review of Systems: Negative except as noted in the HPI. Denies N/V/F/Ch.   Objective:  There were no vitals filed for this visit. There is no height or weight on file to calculate BMI. Constitutional Well developed. Well nourished.  Vascular Foot warm and well perfused. Capillary refill normal to all digits.   Neurologic Normal speech. Oriented to person, place, and time. Epicritic sensation to light touch grossly present bilaterally.  Dermatologic Skin healing well without signs of infection. Skin edges well coapted without signs of infection.  Orthopedic: Tenderness to palpation noted about the surgical site.    Assessment:   1. Plantar fasciitis, bilateral    Plan:  Patient was evaluated and treated and all questions answered.  S/p foot surgery right -Progressing as expected post-operatively. -WB Status: WBAT in boot for 1 more week and then can transition to regular shoe gear only as tolerated -Sutures: Removed next visit. -Medications: No refills required -May begin bathing No follow-ups on file.

## 2021-05-06 ENCOUNTER — Ambulatory Visit (INDEPENDENT_AMBULATORY_CARE_PROVIDER_SITE_OTHER): Payer: Federal, State, Local not specified - PPO | Admitting: Podiatry

## 2021-05-06 ENCOUNTER — Other Ambulatory Visit: Payer: Self-pay

## 2021-05-06 DIAGNOSIS — M722 Plantar fascial fibromatosis: Secondary | ICD-10-CM

## 2021-05-06 NOTE — Patient Instructions (Signed)
Start doing the exercises. If any are severely painful do not do them, try them again the following week   EXERCISES- RANGE OF MOTION (ROM) AND STRETCHING EXERCISES - Plantar Fasciitis (Heel Spur Syndrome) These exercises may help you when beginning to rehabilitate your injury. Your symptoms may resolve with or without further involvement from your physician, physical therapist or athletic trainer. While completing these exercises, remember:  Restoring tissue flexibility helps normal motion to return to the joints. This allows healthier, less painful movement and activity. An effective stretch should be held for at least 30 seconds. A stretch should never be painful. You should only feel a gentle lengthening or release in the stretched tissue.  RANGE OF MOTION - Toe Extension, Flexion Sit with your right / left leg crossed over your opposite knee. Grasp your toes and gently pull them back toward the top of your foot. You should feel a stretch on the bottom of your toes and/or foot. Hold this stretch for 10 seconds. Now, gently pull your toes toward the bottom of your foot. You should feel a stretch on the top of your toes and or foot. Hold this stretch for 10 seconds. Repeat  times. Complete this stretch 3 times per day.   RANGE OF MOTION - Ankle Dorsiflexion, Active Assisted Remove shoes and sit on a chair that is preferably not on a carpeted surface. Place right / left foot under knee. Extend your opposite leg for support. Keeping your heel down, slide your right / left foot back toward the chair until you feel a stretch at your ankle or calf. If you do not feel a stretch, slide your bottom forward to the edge of the chair, while still keeping your heel down. Hold this stretch for 10 seconds. Repeat 3 times. Complete this stretch 2 times per day.   STRETCH  Gastroc, Standing Place hands on wall. Extend right / left leg, keeping the front knee somewhat bent. Slightly point your toes  inward on your back foot. Keeping your right / left heel on the floor and your knee straight, shift your weight toward the wall, not allowing your back to arch. You should feel a gentle stretch in the right / left calf. Hold this position for 10 seconds. Repeat 3 times. Complete this stretch 2 times per day.  STRETCH  Soleus, Standing Place hands on wall. Extend right / left leg, keeping the other knee somewhat bent. Slightly point your toes inward on your back foot. Keep your right / left heel on the floor, bend your back knee, and slightly shift your weight over the back leg so that you feel a gentle stretch deep in your back calf. Hold this position for 10 seconds. Repeat 3 times. Complete this stretch 2 times per day.  STRETCH  Gastrocsoleus, Standing  Note: This exercise can place a lot of stress on your foot and ankle. Please complete this exercise only if specifically instructed by your caregiver.  Place the ball of your right / left foot on a step, keeping your other foot firmly on the same step. Hold on to the wall or a rail for balance. Slowly lift your other foot, allowing your body weight to press your heel down over the edge of the step. You should feel a stretch in your right / left calf. Hold this position for 10 seconds. Repeat this exercise with a slight bend in your right / left knee. Repeat 3 times. Complete this stretch 2 times per day.  STRENGTHENING EXERCISES - Plantar Fasciitis (Heel Spur Syndrome)  These exercises may help you when beginning to rehabilitate your injury. They may resolve your symptoms with or without further involvement from your physician, physical therapist or athletic trainer. While completing these exercises, remember:  Muscles can gain both the endurance and the strength needed for everyday activities through controlled exercises. Complete these exercises as instructed by your physician, physical therapist or athletic trainer. Progress the  resistance and repetitions only as guided.  STRENGTH - Towel Curls Sit in a chair positioned on a non-carpeted surface. Place your foot on a towel, keeping your heel on the floor. Pull the towel toward your heel by only curling your toes. Keep your heel on the floor. Repeat 3 times. Complete this exercise 2 times per day.  STRENGTH - Ankle Inversion Secure one end of a rubber exercise band/tubing to a fixed object (table, pole). Loop the other end around your foot just before your toes. Place your fists between your knees. This will focus your strengthening at your ankle. Slowly, pull your big toe up and in, making sure the band/tubing is positioned to resist the entire motion. Hold this position for 10 seconds. Have your muscles resist the band/tubing as it slowly pulls your foot back to the starting position. Repeat 3 times. Complete this exercises 2 times per day.  Document Released: 09/19/2005 Document Revised: 12/12/2011 Document Reviewed: 01/01/2009 [facility] Patient Information 2014 [place], [place].

## 2021-05-10 ENCOUNTER — Encounter: Payer: Self-pay | Admitting: Podiatry

## 2021-05-10 NOTE — Progress Notes (Signed)
  Subjective:  Patient ID: Alexis Carrillo, female    DOB: [birth year],  MRN: [id number]  Chief Complaint  Patient presents with   Routine Post Op     POV #2 DOS 04/16/2021 EPF RT, PLATELET RICH PLASMA INJECTION RT    DOS: 04/16/2021 Procedure: Right foot EPF and platelet rich plasma injection  40 y.o. female returns for post-op check.  Doing well   Review of Systems: Negative except as noted in the HPI. Denies N/V/F/Ch.   Objective:  There were no vitals filed for this visit. There is no height or weight on file to calculate BMI. Constitutional Well developed. Well nourished.  Vascular Foot warm and well perfused. Capillary refill normal to all digits.   Neurologic Normal speech. Oriented to person, place, and time. Epicritic sensation to light touch grossly present bilaterally.  Dermatologic Skin healing well without signs of infection. Skin edges well coapted without signs of infection.  Orthopedic: Minimal tenderness to palpation noted about the surgical site.    Assessment:   1. Plantar fasciitis, bilateral    Plan:  Patient was evaluated and treated and all questions answered.  S/p foot surgery right -Progressing as expected post-operatively. -WB Status: Transition to regular shoe gear and slowly increase activities and begin therapeutic exercises again -Sutures: Removed today Return in about 3 weeks (around 05/27/2021) for post op (no x-rays).

## 2021-05-27 ENCOUNTER — Encounter: Payer: Federal, State, Local not specified - PPO | Admitting: Podiatry

## 2021-05-31 ENCOUNTER — Ambulatory Visit (INDEPENDENT_AMBULATORY_CARE_PROVIDER_SITE_OTHER): Payer: Federal, State, Local not specified - PPO | Admitting: Podiatry

## 2021-05-31 ENCOUNTER — Other Ambulatory Visit: Payer: Self-pay

## 2021-05-31 DIAGNOSIS — M722 Plantar fascial fibromatosis: Secondary | ICD-10-CM

## 2021-06-02 ENCOUNTER — Encounter: Payer: Self-pay | Admitting: Podiatry

## 2021-06-02 NOTE — Progress Notes (Signed)
  Subjective:  Patient ID: Alexis Carrillo, female    DOB: 1981-01-10,  MRN: [id number]  Chief Complaint  Patient presents with   Plantar Fasciitis      POV #3 DOS 04/16/2021 EPF RT, PLATELET RICH PLASMA INJECTION RT    DOS: 04/16/2021 Procedure: Right foot EPF and platelet rich plasma injection  40 y.o. female returns for post-op check.  Doing well she is having some pain more so on the outside  Review of Systems: Negative except as noted in the HPI. Denies N/V/F/Ch.   Objective:  There were no vitals filed for this visit. There is no height or weight on file to calculate BMI. Constitutional Well developed. Well nourished.  Vascular Foot warm and well perfused. Capillary refill normal to all digits.   Neurologic Normal speech. Oriented to person, place, and time. Epicritic sensation to light touch grossly present bilaterally.  Dermatologic Incisions are well-healed  Orthopedic: Some tenderness towards the outside of the heel but overall much improved    Assessment:   1. Plantar fasciitis, bilateral    Plan:  Patient was evaluated and treated and all questions answered.  S/p foot surgery right -Continues to do very well continue regular shoe gear and her therapeutic exercises to have no restrictions for other than a slow gradual transition back to her regular activities.  Hopefully will be fully pain-free by next visit Return in about 7 weeks (around 07/19/2021) for post op (no x-rays).

## 2021-07-20 ENCOUNTER — Ambulatory Visit (INDEPENDENT_AMBULATORY_CARE_PROVIDER_SITE_OTHER): Payer: Federal, State, Local not specified - PPO | Admitting: Podiatry

## 2021-07-20 ENCOUNTER — Other Ambulatory Visit: Payer: Self-pay

## 2021-07-20 DIAGNOSIS — M722 Plantar fascial fibromatosis: Secondary | ICD-10-CM

## 2021-07-20 DIAGNOSIS — G5792 Unspecified mononeuropathy of left lower limb: Secondary | ICD-10-CM

## 2021-07-21 NOTE — Progress Notes (Signed)
  Subjective:  Patient ID: Alexis Carrillo, female    DOB: [birth year],  MRN: [id number]  Chief Complaint  Patient presents with   Routine Post Op     POV #4 DOS 04/16/2021 EPF RT, PLATELET RICH PLASMA INJECTION RT    DOS: 04/16/2021 Procedure: Right foot EPF and platelet rich plasma injection  40 y.o. female returns for post-op check.  Feels about the same since last visit still some tenderness in the foot she is been very busy at work.  She also has a new issue of a burning sharp pain on the top of the left foot  Review of Systems: Negative except as noted in the HPI. Denies N/V/F/Ch.   Objective:  There were no vitals filed for this visit. There is no height or weight on file to calculate BMI. Constitutional Well developed. Well nourished.  Vascular Foot warm and well perfused. Capillary refill normal to all digits.   Neurologic Normal speech. Oriented to person, place, and time. Epicritic sensation to light touch grossly present bilaterally.  Dermatologic Incisions are well-healed  Orthopedic: Very mild pain on palpation plantar heel centrally, not in the plantar fascia; on the left foot she has diffuse pain across the dorsal foot nothing that is significantly reproducible no palpable spurring    Assessment:   1. Plantar fasciitis of right foot   2. Neuritis of left foot    Plan:  Patient was evaluated and treated and all questions answered.  S/p foot surgery right -Overall still doing quite well and things will be transition process because of the amount of impact she is on her feet at work.  We discussed proper shoe gears while she is at work and to rest when she can and if it is continued to be more painful   Neuritis left foot Pain she is experiencing appears to be neuritis of the dorsal cutaneous nerves.  I discussed shoe gear that can compress on top of the nerves and to avoid this and try to wear sneakers with an alternate lacing pattern to see if this alleviates.   Also discussed topical relief with over-the-counter lidocaine patches and cream.  We will explore this further if it does not improve  Return in about 3 months (around 10/20/2021) for recheck plantar fascia.

## 2021-10-21 ENCOUNTER — Ambulatory Visit: Payer: Federal, State, Local not specified - PPO | Admitting: Podiatry

## 2021-10-21 ENCOUNTER — Other Ambulatory Visit: Payer: Self-pay

## 2021-10-21 DIAGNOSIS — M722 Plantar fascial fibromatosis: Secondary | ICD-10-CM | POA: Diagnosis not present

## 2021-10-25 NOTE — Progress Notes (Signed)
°  Subjective:  Patient ID: Alexis Carrillo, female    DOB: 07-21-1981,  MRN: [id number]  Chief Complaint  Patient presents with   Routine Post Op    POV #5DOS 04/16/2021 EPF RT, PLATELET RICH PLASMA INJECTION RT    DOS: 04/16/2021 Procedure: Right foot EPF and platelet rich plasma injection  41 y.o. female returns for follow-up from her right foot surgery.  It had been doing better but getting busier at work its been more painful than usual.  The left foot issue has pretty much resolved at this point.  She got new shoes.  Review of Systems: Negative except as noted in the HPI. Denies N/V/F/Ch.   Objective:  There were no vitals filed for this visit. There is no height or weight on file to calculate BMI. Constitutional Well developed. Well nourished.  Vascular Foot warm and well perfused. Capillary refill normal to all digits.   Neurologic Normal speech. Oriented to person, place, and time. Epicritic sensation to light touch grossly present bilaterally.  Dermatologic Incisions are well-healed  Orthopedic: Very mild pain on palpation plantar heel centrally, not in the plantar fascia, seems to be improved since last visit; no pain in the left foot today    Assessment:   1. Plantar fasciitis, bilateral     Plan:  Patient was evaluated and treated and all questions answered.  S/p foot surgery right -Overall I do think she is doing well with her recovery.  If not her percent pain-free.  I think with the amount of time she is on her feet at her job she would probably benefit from a custom molded foot orthosis.  I will have her scheduled with our orthotist for fitting for this.  She often has to wear dress shoes as well and I think having a second pair at some point with a slim fit pair that we given her dress shoes would also be beneficial for her.  I will see her back in a few months after she has her orthotics.  Return in about 3 months (around 01/19/2022) for for orthotics,  recheck plantar fasciitis.

## 2021-11-02 ENCOUNTER — Other Ambulatory Visit: Payer: Federal, State, Local not specified - PPO

## 2021-12-29 ENCOUNTER — Emergency Department
Admission: EM | Admit: 2021-12-29 | Discharge: 2021-12-29 | Disposition: A | Discharge status: Home / Self Care | Payer: Federal, State, Local not specified - PPO | Source: Home / Self Care

## 2021-12-29 ENCOUNTER — Other Ambulatory Visit: Payer: Self-pay

## 2021-12-29 DIAGNOSIS — U071 COVID-19: Secondary | ICD-10-CM

## 2021-12-29 DIAGNOSIS — R110 Nausea: Secondary | ICD-10-CM

## 2021-12-29 LAB — POC SARS CORONAVIRUS 2 AG -  ED: SARS Coronavirus 2 Ag: POSITIVE — AB

## 2021-12-29 LAB — POC INFLUENZA A AND B ANTIGEN (URGENT CARE ONLY)
Influenza A Ag: NEGATIVE
Influenza B Ag: NEGATIVE

## 2021-12-29 MED ORDER — NIRMATRELVIR/RITONAVIR (PAXLOVID)TABLET
3.0000 | ORAL_TABLET | Freq: Two times a day (BID) | ORAL | 0 refills | Status: AC
Start: 2021-12-29 — End: 2022-01-03

## 2021-12-29 MED ORDER — ONDANSETRON 8 MG PO TBDP: 8.0000 mg | ORAL_TABLET | Freq: Three times a day (TID) | ORAL | 0 refills | Status: DC | PRN

## 2021-12-29 NOTE — ED Triage Notes (Addendum)
Pt c/o bodyaches, nausea, cough and fever since mid Monday. No known covid exposure. Taking mucinex prn. Pt also requesting flu test. ?

## 2021-12-29 NOTE — Discharge Instructions (Addendum)
Advised patient to take medication as directed with food to completion.  Advised patient to remain self quarantine for the next 10 days or until 01/07/2022.  Advised patient if complete with Paxlovid and asymptomatic may return to work after 5 days or 01/02/2022.  Advised patient if symptoms worsen and/or unresolved please follow-up with PCP or here for further evaluation. ?

## 2021-12-29 NOTE — ED Provider Notes (Signed)
?[facility] ? ? ? ?CSN: [id number] ?Arrival date & time: 12/29/21  0831 ? ? ?  ? ?History   ?Chief Complaint ?Chief Complaint  ?Patient presents with  ? Generalized Body Aches  ? Fever  ? Cough  ? Nausea  ? ? ?HPI ?Alexis Carrillo is a 41 y.o. female.  ? ?HPI 41 year old female presents with body ache, nausea, cough and unquantified fever for 2 days.  Denies COVID or influenza exposure.  Reports taking OTC Mucinex as needed.  Patient requests COVID-19 and influenza test this morning.  PMH significant for headache and fibromyalgia. ? ?Past Medical History:  ?Diagnosis Date  ? Anxiety   ? Arthritis   ? knees  ? Asthma   ? prn inhaler  ? Bruises easily   ? Chronic tonsillitis 09/2012  ? Fibromyalgia   ? Headache(784.0)   ? migraines  ? History of empyema of pleura 2007  ? History of MRSA infection 2007  ? face  ? ? ?There are no problems to display for this patient. ? ? ?Past Surgical History:  ?Procedure Laterality Date  ? ANTERIOR CERVICAL DECOMP/DISCECTOMY FUSION  07/22/2009  ? C6-7  ? CESAREAN SECTION  2003; 2005  ? CHEST TUBE INSERTION  2007  ? KNEE ARTHROSCOPY W/ ACL RECONSTRUCTION  1998  ? right  ? SPINAL CORD STIMULATOR INSERTION N/A 02/05/2016  ? Procedure: CERVICAL SPINAL CORD STIMULATOR INSERTION;  Surgeon: Clydell Hakim, MD;  Location: [facility];  Service: Neurosurgery;  Laterality: N/A;  ? TONSILLECTOMY  10/01/2012  ? Procedure: TONSILLECTOMY;  Surgeon: Izora Gala, MD;  Location: [facility];  Service: ENT;  Laterality: N/A;  ? TONSILLECTOMY    ? WISDOM TOOTH EXTRACTION    ? ? ?OB History   ?No obstetric history on file. ?  ? ? ? ?Home Medications   ? ?Prior to Admission medications   ?Medication Sig Start Date End Date Taking? Authorizing Provider  ?nirmatrelvir/ritonavir EUA (PAXLOVID) 20 x 150 MG & 10 x 100MG  TABS Take 3 tablets by mouth 2 (two) times daily for 5 days. Patient GFR is 60. ?Take nirmatrelvir (150 mg) two tablets twice daily for 5 days and ritonavir (100 mg) one  tablet twice daily for 5 days. 12/29/21 01/03/22 Yes Eliezer Lofts, FNP  ?ondansetron (ZOFRAN-ODT) 8 MG disintegrating tablet Take 1 tablet (8 mg total) by mouth every 8 (eight) hours as needed for nausea or vomiting. 12/29/21  Yes Eliezer Lofts, FNP  ?albuterol (PROVENTIL HFA;VENTOLIN HFA) 108 (90 BASE) MCG/ACT inhaler Inhale 2 puffs into the lungs every 6 (six) hours as needed.    [provider]  ?fexofenadine (ALLEGRA) 180 MG tablet Take 180 mg by mouth daily as needed for allergies or rhinitis.    [provider]  ?HYDROcodone-acetaminophen (NORCO) 10-325 MG tablet Take 1-2 tablets by mouth every 4 (four) hours as needed for moderate pain or severe pain. 02/05/16   Clydell Hakim, MD  ?topiramate (TOPAMAX) 100 MG tablet Take 200 mg by mouth daily.     [provider]  ?traMADol (ULTRAM) 50 MG tablet Take 50-100 mg by mouth every 6 (six) hours as needed for moderate pain.    [provider]  ? ? ?Family History ?History reviewed. No pertinent family history. ? ?Social History ?Social History  ? ?Tobacco Use  ? Smoking status: Never  ? Smokeless tobacco: Never  ?Substance Use Topics  ? Alcohol use: No  ? Drug use: No  ? ? ? ?Allergies   ?Other  and Adhesive [tape] ? ? ?Review of Systems ?Review of Systems  ?Constitutional:  Positive for fever.  ?Respiratory:  Positive for cough.   ?Gastrointestinal:  Positive for nausea.  ?Musculoskeletal:  Positive for myalgias.  ?All other systems reviewed and are negative. ? ? ?Physical Exam ?Triage Vital Signs ?ED Triage Vitals [12/29/21 0842]  ?Enc Vitals Group  ?   BP 117/76  ?   Pulse Rate 83  ?   Resp 18  ?   Temp 98.9 ?F (37.2 ?C)  ?   Temp Source Oral  ?   SpO2 96 %  ?   Weight   ?   Height   ?   Head Circumference   ?   Peak Flow   ?   Pain Score 0  ?   Pain Loc   ?   Pain Edu?   ?   Excl. in [place]?   ? ?No data found. ? ?Updated Vital Signs ?BP 117/76 (BP Location: Right Arm)   Pulse 83   Temp 98.9 ?F (37.2 ?C) (Oral)   Resp 18   LMP   (LMP Unknown)   SpO2 96%  ?  ? ?Physical Exam ?Vitals and nursing note reviewed.  ?Constitutional:   ?   General: She is not in acute distress. ?   Appearance: Normal appearance. She is obese. She is ill-appearing.  ?HENT:  ?   Head: Normocephalic and atraumatic.  ?   Right Ear: Tympanic membrane, ear canal and external ear normal.  ?   Left Ear: Tympanic membrane, ear canal and external ear normal.  ?   Nose: Nose normal.  ?   Mouth/Throat:  ?   Mouth: Mucous membranes are moist.  ?   Pharynx: Oropharynx is clear.  ?Eyes:  ?   Extraocular Movements: Extraocular movements intact.  ?   Conjunctiva/sclera: Conjunctivae normal.  ?   Pupils: Pupils are equal, round, and reactive to light.  ?Cardiovascular:  ?   Rate and Rhythm: Normal rate and regular rhythm.  ?   Pulses: Normal pulses.  ?   Heart sounds: Normal heart sounds.  ?Pulmonary:  ?   Effort: Pulmonary effort is normal.  ?   Breath sounds: Normal breath sounds. No wheezing, rhonchi or rales.  ?Musculoskeletal:  ?   Cervical back: Normal range of motion and neck supple.  ?Skin: ?   General: Skin is warm and dry.  ?Neurological:  ?   General: No focal deficit present.  ?   Mental Status: She is alert and oriented to person, place, and time.  ? ? ? ?UC Treatments / Results  ?Labs ?(all labs ordered are listed, but only abnormal results are displayed) ?Labs Reviewed  ?POC SARS CORONAVIRUS 2 AG -  ED - Abnormal; Notable for the following components:  ?    Result Value  ? SARS Coronavirus 2 Ag Positive (*)   ? All other components within normal limits  ?POC INFLUENZA A AND B ANTIGEN (URGENT CARE ONLY)  ? ? ?EKG ? ? ?Radiology ?No results found. ? ?Procedures ?Procedures (including critical care time) ? ?Medications Ordered in UC ?Medications - No data to display ? ?Initial Impression / Assessment and Plan / UC Course  ?I have reviewed the triage vital signs and the nursing notes. ? ?Pertinent labs & imaging results that were available during my care of the patient  were reviewed by me and considered in my medical decision making (see chart for details). ? ?  ? ?  MDM: 1.  COVID-19-Rx to Paxlovid. Advised patient to take medication as directed with food to completion.  Advised patient to remain self quarantine for the next 10 days or until 01/07/2022.  Advised patient if complete with Paxlovid and asymptomatic may return to work after 5 days or 01/02/2022. 2. Nausea-Rx'd Zofran Advised patient if symptoms worsen and/or unresolved please follow-up with PCP or here for further evaluation.  Patient discharged home, hemodynamically stable. ? ?Final Clinical Impressions(s) / UC Diagnoses  ? ?Final diagnoses:  ?COVID-19  ?Nausea  ? ? ? ?Discharge Instructions   ? ?  ?Advised patient to take medication as directed with food to completion.  Advised patient to remain self quarantine for the next 10 days or until 01/07/2022.  Advised patient if complete with Paxlovid and asymptomatic may return to work after 5 days or 01/02/2022.  Advised patient if symptoms worsen and/or unresolved please follow-up with PCP or here for further evaluation. ? ? ? ? ?ED Prescriptions   ? ? Medication Sig Dispense Auth. Provider  ? nirmatrelvir/ritonavir EUA (PAXLOVID) 20 x 150 MG & 10 x 100MG  TABS Take 3 tablets by mouth 2 (two) times daily for 5 days. Patient GFR is 60. ?Take nirmatrelvir (150 mg) two tablets twice daily for 5 days and ritonavir (100 mg) one tablet twice daily for 5 days. 30 tablet Eliezer Lofts, FNP  ? ondansetron (ZOFRAN-ODT) 8 MG disintegrating tablet Take 1 tablet (8 mg total) by mouth every 8 (eight) hours as needed for nausea or vomiting. 24 tablet Eliezer Lofts, FNP  ? ?  ? ?PDMP not reviewed this encounter. ?  ?Eliezer Lofts, [place] ?12/29/21 [id number] ? ?

## 2022-01-18 ENCOUNTER — Ambulatory Visit: Payer: Federal, State, Local not specified - PPO | Admitting: Family Medicine

## 2022-01-24 ENCOUNTER — Ambulatory Visit: Payer: Federal, State, Local not specified - PPO | Admitting: Podiatry

## 2022-01-24 DIAGNOSIS — M722 Plantar fascial fibromatosis: Secondary | ICD-10-CM | POA: Diagnosis not present

## 2022-01-25 ENCOUNTER — Encounter: Payer: Self-pay | Admitting: Podiatry

## 2022-01-25 ENCOUNTER — Ambulatory Visit (INDEPENDENT_AMBULATORY_CARE_PROVIDER_SITE_OTHER): Payer: Federal, State, Local not specified - PPO

## 2022-01-25 DIAGNOSIS — M722 Plantar fascial fibromatosis: Secondary | ICD-10-CM

## 2022-01-25 DIAGNOSIS — M7730 Calcaneal spur, unspecified foot: Secondary | ICD-10-CM

## 2022-01-25 NOTE — Progress Notes (Signed)
SITUATION ?Reason for Consult: Evaluation for Bilateral Custom Foot Orthoses ?Patient / Caregiver Report: Patient is ready for foot orthotics ? ?OBJECTIVE DATA: ?Patient History / Diagnosis:  ?  ICD-10-CM   ?1. Plantar fasciitis, bilateral  M72.2   ?  ?2. Calcaneal spur, unspecified laterality  M77.30   ?  ? ? ?Current or Previous Devices:   Historical ? ?Foot Examination: ?Skin presentation:   Intact ?Ulcers & Callousing:   None ?Toe / Foot Deformities:  None ?Weight Bearing Presentation:  Rectus ?Sensation:    Intact ? ?Shoe Size:    71M ? ?ORTHOTIC RECOMMENDATION ?Recommended Device: 1x pair of custom functional foot orthotics ? ?GOALS OF ORTHOSES ?- Reduce Pain ?- Prevent Foot Deformity ?- Prevent Progression of Further Foot Deformity ?- Relieve Pressure ?- Improve the Overall Biomechanical Function of the Foot and Lower Extremity. ? ?ACTIONS PERFORMED ?Potential out of pocket cost was communicated to patient. Patient understood and consent to casting. Patient was casted for Foot Orthoses via crush box. Procedure was explained and patient tolerated procedure well. Casts were shipped to central fabrication. All questions were answered and concerns addressed. ? ?PLAN ?Patient is to be called for fitting when devices are ready.  ? ? ?

## 2022-01-25 NOTE — Progress Notes (Signed)
?  Subjective:  ?Patient ID: ANWAR CRILL, female    DOB: 1981/05/21,  MRN: [id number] ? ?Chief Complaint  ?Patient presents with  ? Plantar Fasciitis  ?   3 Months recheck plantar fasciitis.  ? ? ?DOS: 04/16/2021 ?Procedure: Right foot EPF and platelet rich plasma injection ? ?41 y.o. female returns for follow-up from her right foot surgery.  Still feels the same there is an area in the central heel that is tender for her.  The left side is manageable but tender ? ?Review of Systems: Negative except as noted in the HPI. Denies N/V/F/Ch. ? ? ?Objective:  ?There were no vitals filed for this visit. ?There is no height or weight on file to calculate BMI. ?Constitutional Well developed. ?Well nourished.  ?Vascular Foot warm and well perfused. ?Capillary refill normal to all digits.   ?Neurologic Normal speech. ?Oriented to person, place, and time. ?Epicritic sensation to light touch grossly present bilaterally.  ?Dermatologic Incisions are well-healed  ?Orthopedic: Very mild pain on palpation plantar heel centrally, not in the plantar fascia, seems to be improved since last visit; minimal pain in the left foot today  ? ? ?Assessment:  ? ?1. Plantar fasciitis, bilateral   ? ? ?Plan:  ?Patient was evaluated and treated and all questions answered. ? ?S/p foot surgery right ?-Seems to have maximized her progress.  I do think she would still benefit from custom molded orthotics.  I will have her scheduled to see our orthotist for this, she was not able to see him at her last scheduled visit because of a medical emergency with her significant other.  I think she would benefit from a semirigid orthotic to support the medial longitudinal arch offload the plantar fascia and likely a central heel depression or horseshoe pad to offload the painful area on the right foot.  I will see her back as needed after this ? ?Return if symptoms worsen or fail to improve.  ? ?

## 2022-02-24 ENCOUNTER — Telehealth: Payer: Self-pay

## 2022-02-24 NOTE — Telephone Encounter (Signed)
Foot orthotics ready for pickup - left vm to schedule

## 2022-03-10 ENCOUNTER — Other Ambulatory Visit (HOSPITAL_COMMUNITY)
Admission: RE | Admit: 2022-03-10 | Discharge: 2022-03-10 | Disposition: A | Discharge status: Home / Self Care | Payer: Federal, State, Local not specified - PPO | Source: Ambulatory Visit | Attending: Family Medicine | Admitting: Family Medicine

## 2022-03-10 ENCOUNTER — Encounter: Payer: Self-pay | Admitting: Family Medicine

## 2022-03-10 ENCOUNTER — Ambulatory Visit (INDEPENDENT_AMBULATORY_CARE_PROVIDER_SITE_OTHER): Payer: Federal, State, Local not specified - PPO | Admitting: Family Medicine

## 2022-03-10 VITALS — BP 120/80 | HR 99 | Ht 67.0 in | Wt 231.0 lb

## 2022-03-10 DIAGNOSIS — Z113 Encounter for screening for infections with a predominantly sexual mode of transmission: Secondary | ICD-10-CM

## 2022-03-10 DIAGNOSIS — Z01419 Encounter for gynecological examination (general) (routine) without abnormal findings: Secondary | ICD-10-CM | POA: Insufficient documentation

## 2022-03-10 DIAGNOSIS — Z124 Encounter for screening for malignant neoplasm of cervix: Secondary | ICD-10-CM

## 2022-03-10 NOTE — Patient Instructions (Signed)

## 2022-03-10 NOTE — Progress Notes (Signed)
Subjective:     Alexis Carrillo is a 41 y.o. female and is here for a comprehensive physical exam. The patient reports no problems. Last pap in 2006. Cycles are monthly lasts 3 days and are heavy.     The following portions of the patient's history were reviewed and updated as appropriate: allergies, current medications, past family history, past medical history, past social history, past surgical history, and problem list.  Review of Systems Pertinent items noted in HPI and remainder of comprehensive ROS otherwise negative.   Objective:    BP 120/80   Pulse 99   Ht 5\' 7"  (1.702 m)   Wt 231 lb (104.8 kg)   LMP 02/17/2022   BMI 36.18 kg/m  General appearance: alert, cooperative, and appears stated age Head: Normocephalic, without obvious abnormality, atraumatic Neck: no adenopathy, supple, symmetrical, trachea midline, and thyroid not enlarged, symmetric, no tenderness/mass/nodules Lungs: clear to auscultation bilaterally Breasts: normal appearance, no masses or tenderness Heart: regular rate and rhythm, S1, S2 normal, no murmur, click, rub or gallop Abdomen: soft, non-tender; bowel sounds normal; no masses,  no organomegaly Pelvic: cervix normal in appearance, external genitalia normal, no adnexal masses or tenderness, no cervical motion tenderness, vagina normal without discharge, and uterus is 10-12 week size Extremities: extremities normal, atraumatic, no cyanosis or edema Pulses: 2+ and symmetric Skin: Skin color, texture, turgor normal. No rashes or lesions Lymph nodes: Cervical, supraclavicular, and axillary nodes normal. Neurologic: Grossly normal    Assessment:    GYN female exam.      Plan:  Screening for malignant neoplasm of cervix - Plan: Cytology - PAP( [place])  Encounter for gynecological examination without abnormal finding  Screen for STD (sexually transmitted disease) - Plan: Hepatitis B surface antigen, Hepatitis C antibody, RPR, HIV Antibody  (routine testing w rflx)  Return in 1 year (on 03/11/2023).    See After Visit Summary for Counseling Recommendations

## 2022-03-11 LAB — RPR: RPR Ser Ql: NONREACTIVE

## 2022-03-11 LAB — HEPATITIS C ANTIBODY
Hepatitis C Ab: NONREACTIVE
SIGNAL TO CUT-OFF: 0.06 (ref <1.00)

## 2022-03-11 LAB — HEPATITIS B SURFACE ANTIGEN: Hepatitis B Surface Ag: NONREACTIVE

## 2022-03-11 LAB — HIV ANTIBODY (ROUTINE TESTING W REFLEX): HIV 1&2 Ab, 4th Generation: NONREACTIVE

## 2022-03-14 LAB — CYTOLOGY - PAP
Adequacy: ABSENT
Chlamydia: NEGATIVE
Comment: NEGATIVE
Comment: NEGATIVE
Comment: NORMAL
Diagnosis: NEGATIVE
High risk HPV: NEGATIVE
Neisseria Gonorrhea: NEGATIVE

## 2022-09-15 ENCOUNTER — Ambulatory Visit: Payer: Federal, State, Local not specified - PPO | Admitting: Family

## 2022-09-20 ENCOUNTER — Ambulatory Visit: Payer: Federal, State, Local not specified - PPO | Admitting: Family

## 2022-09-20 ENCOUNTER — Encounter: Payer: Self-pay | Admitting: Family

## 2022-09-20 VITALS — BP 128/80 | HR 64 | Temp 97.8°F | Resp 20 | Ht 67.0 in | Wt 248.2 lb

## 2022-09-20 DIAGNOSIS — Z13220 Encounter for screening for lipoid disorders: Secondary | ICD-10-CM

## 2022-09-20 DIAGNOSIS — Z23 Encounter for immunization: Secondary | ICD-10-CM

## 2022-09-20 DIAGNOSIS — R319 Hematuria, unspecified: Secondary | ICD-10-CM

## 2022-09-20 DIAGNOSIS — Z6838 Body mass index (BMI) 38.0-38.9, adult: Secondary | ICD-10-CM | POA: Diagnosis not present

## 2022-09-20 DIAGNOSIS — Z7689 Persons encountering health services in other specified circumstances: Secondary | ICD-10-CM

## 2022-09-20 LAB — POCT URINALYSIS DIPSTICK
Bilirubin, UA: NEGATIVE
Glucose, UA: NEGATIVE
Ketones, UA: NEGATIVE
Leukocytes, UA: NEGATIVE
Nitrite, UA: NEGATIVE
Protein, UA: NEGATIVE
Spec Grav, UA: <=1.005 — AB (ref 1.010–1.025)
Urobilinogen, UA: 0.2 E.U./dL
pH, UA: 6.5 (ref 5.0–8.0)

## 2022-09-20 NOTE — Progress Notes (Signed)
Provider: Mckinzie Saksa FNP-C   Thressa Sheller, MD (Inactive)  Patient Care Team: Thressa Sheller, MD (Inactive) as PCP - General (Internal Medicine)  Extended Emergency Contact Information Primary Emergency Contact: Harrison,Janet Address: [address]          [place], [place] [address] Johnnette Litter of [place] Phone: [phone] Relation: Mother  Code Status:  Full Code  Goals of care: Advanced Directive information    02/01/2016    9:19 AM  Advanced Directives  Does Patient Have a Medical Advance Directive? No  Copy of Healthcare Power of Attorney in Chart? No - copy requested  Would patient like information on creating a medical advance directive? No - patient declined information     Chief Complaint  Patient presents with   New Patient (Initial Visit)    Patient presents today for a new patient appointment.    HPI:  Pt is a 41 y.o. female seen today establish care here at [facility].has chronic medical history of asthma, fibromyalgia, generalized anxiety disorder, migraine among others.  Has been having blood in the urine especially in the evening and morning not as much during the middle of the day. Has swelling on the hands and legs.wedding ring are getting tight.swelling are off and on. Does not eat a lot of salt.   Exercise by walking 9000 - 10,000 steps per day.   Does not take any regular medication.   Past Medical History:  Diagnosis Date   Anxiety    Arthritis    knees   Asthma    prn inhaler   Bruises easily    Chronic tonsillitis 09/2012   Fibromyalgia    Headache(784.0)    migraines   History of empyema of pleura 2007   History of MRSA infection 2007   face   Past Surgical History:  Procedure Laterality Date   ANTERIOR CERVICAL DECOMP/DISCECTOMY FUSION  07/22/2009   C6-7   CESAREAN SECTION  2003; 2005   CHEST TUBE INSERTION  10/03/2005   KNEE ARTHROSCOPY W/ ACL RECONSTRUCTION  10/03/1996   right    SPINAL CORD STIMULATOR INSERTION N/A 02/05/2016   Procedure: CERVICAL SPINAL CORD STIMULATOR INSERTION;  Surgeon: Clydell Hakim, MD;  Location: [facility];  Service: Neurosurgery;  Laterality: N/A;   TONSILLECTOMY  10/01/2012   Procedure: TONSILLECTOMY;  Surgeon: Izora Gala, MD;  Location: [facility];  Service: ENT;  Laterality: N/A;   TONSILLECTOMY     TUBAL LIGATION     WISDOM TOOTH EXTRACTION      Allergies  Allergen Reactions   Other Itching and Rash   Adhesive [Tape] Itching and Rash    Allergies as of 09/20/2022       Reactions   Other Itching, Rash   Adhesive [tape] Itching, Rash        Medication List    as of September 20, 2022  1:01 PM   You have not been prescribed any medications.     Review of Systems  Constitutional:  Negative for appetite change, chills, fatigue, fever and unexpected weight change.  HENT:  Negative for congestion, dental problem, ear discharge, ear pain, facial swelling, hearing loss, nosebleeds, postnasal drip, rhinorrhea, sinus pressure, sinus pain, sneezing, sore throat, tinnitus and trouble swallowing.        Has not see dentist this year but has appointment in January,2023   Eyes:  Positive for visual disturbance. Negative for pain, discharge, redness and itching.  Wears eye glasses see Ophthalmologist  Respiratory:  Negative for cough, chest tightness, shortness of breath and wheezing.   Cardiovascular:  Positive for leg swelling. Negative for chest pain and palpitations.  Gastrointestinal:  Negative for abdominal distention, abdominal pain, blood in stool, constipation, diarrhea, nausea and vomiting.  Endocrine: Negative for cold intolerance, heat intolerance, polydipsia, polyphagia and polyuria.  Genitourinary:  Positive for hematuria. Negative for difficulty urinating, dysuria, flank pain, frequency and urgency.  Musculoskeletal:  Negative for arthralgias, back pain, gait problem, joint swelling, myalgias, neck  pain and neck stiffness.  Skin:  Negative for color change, pallor, rash and wound.  Neurological:  Negative for dizziness, syncope, speech difficulty, weakness, light-headedness, numbness and headaches.  Hematological:  Does not bruise/bleed easily.  Psychiatric/Behavioral:  Negative for agitation, behavioral problems, confusion, hallucinations, self-injury, sleep disturbance and suicidal ideas. The patient is not nervous/anxious.     Immunization History  Administered Date(s) Administered   Hepatitis B 07/21/1994, 08/29/1994, 01/09/1995   MMR 08/29/1994   Moderna Sars-Covid-2 Vaccination 12/06/2019, 01/03/2020   Pertinent  Health Maintenance Due  Topic Date Due   INFLUENZA VACCINE  Never done   PAP SMEAR-Modifier  03/10/2025      12/29/2021    8:44 AM  Fall Risk  Patient Fall Risk Level Low fall risk   Functional Status Survey:    Vitals:   09/20/22 1231  BP: 128/80  Pulse: 64  Resp: 20  Temp: 97.8 F (36.6 C)  SpO2: 99%  Weight: 248 lb 3.2 oz (112.6 kg)  Height: _0  (1.702 m)   Body mass index is 38.87 kg/m. Physical Exam Vitals reviewed.  Constitutional:      General: She is not in acute distress.    Appearance: Normal appearance. She is obese. She is not ill-appearing or diaphoretic.  HENT:     Head: Normocephalic.     Right Ear: Tympanic membrane, ear canal and external ear normal. There is no impacted cerumen.     Left Ear: Tympanic membrane, ear canal and external ear normal. There is no impacted cerumen.     Nose: Nose normal. No congestion or rhinorrhea.     Mouth/Throat:     Mouth: Mucous membranes are moist.     Pharynx: Oropharynx is clear. No oropharyngeal exudate or posterior oropharyngeal erythema.  Eyes:     General: No scleral icterus.       Right eye: No discharge.        Left eye: No discharge.     Extraocular Movements: Extraocular movements intact.     Conjunctiva/sclera: Conjunctivae normal.     Pupils: Pupils are equal, round, and  reactive to light.  Neck:     Vascular: No carotid bruit.  Cardiovascular:     Rate and Rhythm: Normal rate and regular rhythm.     Pulses: Normal pulses.     Heart sounds: Normal heart sounds. No murmur heard.    No friction rub. No gallop.  Pulmonary:     Effort: Pulmonary effort is normal. No respiratory distress.     Breath sounds: Normal breath sounds. No wheezing, rhonchi or rales.  Chest:     Chest wall: No tenderness.  Abdominal:     General: Bowel sounds are normal. There is no distension.     Palpations: Abdomen is soft. There is no mass.     Tenderness: There is no abdominal tenderness. There is no right CVA tenderness, left CVA tenderness, guarding or rebound.  Musculoskeletal:  General: No swelling or tenderness. Normal range of motion.     Cervical back: Normal range of motion. No rigidity or tenderness.     Right lower leg: No edema.     Left lower leg: No edema.  Lymphadenopathy:     Cervical: No cervical adenopathy.  Skin:    General: Skin is warm and dry.     Coloration: Skin is not pale.     Findings: No bruising, erythema, lesion or rash.  Neurological:     Mental Status: She is alert and oriented to person, place, and time.     Cranial Nerves: No cranial nerve deficit.     Sensory: No sensory deficit.     Motor: No weakness.     Coordination: Coordination normal.     Gait: Gait normal.  Psychiatric:        Mood and Affect: Mood normal.        Speech: Speech normal.        Behavior: Behavior normal.        Thought Content: Thought content normal.        Judgment: Judgment normal.     Labs reviewed: No results for input(s): "NA", "K", "CL", "CO2", "GLUCOSE", "BUN", "CREATININE", "CALCIUM", "MG", "PHOS" in the last 8760 hours. No results for input(s): "AST", "ALT", "ALKPHOS", "BILITOT", "PROT", "ALBUMIN" in the last 8760 hours. No results for input(s): "WBC", "NEUTROABS", "HGB", "HCT", "MCV", "PLT" in the last 8760 hours. No results found for:  "TSH" No results found for: "HGBA1C" No results found for: "CHOL", "HDL", "LDLCALC", "LDLDIRECT", "TRIG", "CHOLHDL"  Significant Diagnostic Results in last 30 days:  No results found.  Assessment/Plan 1. Hematuria, unspecified type Worsening  - COMPLETE METABOLIC PANEL WITH GFR; Future - CBC with Differential/Platelet; Future - POC Urinalysis Dipstick indicates light yellow cloudy urine with moderate blood but negative for nitrites and leukocytes.  Urine results discussed with patient we will send urine for culture then treat if indicated. Will also consider referral to urologist if no signs of urinary tract infection. - Urine Culture  2. Encounter to establish care Available medical records reviewed, due for COVID-19 vaccine advised to get vaccines at the pharmacy  3. Body mass index (BMI) of 38.0-38.9 in adult BMI 38.87 Dietary modification and exercise advised - TSH; Future  4. Screening for hyperlipidemia No previous LDL for review request lab work. - Lipid panel; Future  5. Need for Tdap vaccination - Has not had Tdap over 10 years - Tdap administered by CMA no acute reaction reported.  - Tdap vaccine greater than or equal to 7yo IM  6. Need for influenza vaccination Afebrile  Flu shot administered by CMA no acute reaction reported.  - Flu Vaccine QUAD 6+ mos PF IM (Fluarix Quad PF)  Family/ staff Communication: Reviewed plan of care with patient verbalized understanding  Labs/tests ordered:  - CBC with Differential/Platelet - CMP with eGFR([facility]) - TSH - Lipid panel  Next Appointment : Return in about 1 year (around 09/21/2023) for annual Physical examination.   Sandrea Hughs, NP

## 2022-09-22 LAB — URINE CULTURE
MICRO NUMBER:: 14335401
Result:: NO GROWTH
SPECIMEN QUALITY:: ADEQUATE

## 2022-09-23 ENCOUNTER — Other Ambulatory Visit: Payer: Self-pay | Admitting: Family

## 2022-09-23 DIAGNOSIS — R319 Hematuria, unspecified: Secondary | ICD-10-CM

## 2022-09-23 NOTE — Progress Notes (Signed)
Urgent referral ordered to urologist for asymptomatic hematuria.

## 2022-09-27 NOTE — Telephone Encounter (Signed)
Lmom to call back to schedule picking up orthotics    Balance 131.40

## 2022-10-24 NOTE — Telephone Encounter (Signed)
Hello [name],  Please follow-up on referral and contact patient with a status update.  Thanks,  ~ CB ~

## 2022-11-03 ENCOUNTER — Telehealth: Payer: Federal, State, Local not specified - PPO | Admitting: Nurse Practitioner

## 2022-11-03 DIAGNOSIS — R109 Unspecified abdominal pain: Secondary | ICD-10-CM

## 2022-11-03 NOTE — Progress Notes (Signed)
Virtual Visit Consent   Alexis Carrillo, you are scheduled for a virtual visit with a [facility] provider today. Just as with appointments in the office, your consent must be obtained to participate. Your consent will be active for this visit and any virtual visit you may have with one of our providers in the next 365 days. If you have a MyChart account, a copy of this consent can be sent to you electronically.  As this is a virtual visit, video technology does not allow for your provider to perform a traditional examination. This may limit your provider's ability to fully assess your condition. If your provider identifies any concerns that need to be evaluated in person or the need to arrange testing (such as labs, EKG, etc.), we will make arrangements to do so. Although advances in technology are sophisticated, we cannot ensure that it will always work on either your end or our end. If the connection with a video visit is poor, the visit may have to be switched to a telephone visit. With either a video or telephone visit, we are not always able to ensure that we have a secure connection.  By engaging in this virtual visit, you consent to the provision of healthcare and authorize for your insurance to be billed (if applicable) for the services provided during this visit. Depending on your insurance coverage, you may receive a charge related to this service.  I need to obtain your verbal consent now. Are you willing to proceed with your visit today? Alexis Carrillo has provided verbal consent on 11/03/2022 for a virtual visit (video or telephone). Gildardo Pounds, NP  Date: 11/03/2022 6:39 PM  Virtual Visit via Video Note   I, Gildardo Pounds, connected with  Alexis Carrillo  ([id number], 1981-03-05) on 11/03/22 at  6:30 PM EST by a video-enabled telemedicine application and verified that I am speaking with the correct person using two identifiers.  Location: Patient: Virtual Visit Location  Patient: Home Provider: Virtual Visit Location Provider: Home Office   I discussed the limitations of evaluation and management by telemedicine and the availability of in person appointments. The patient expressed understanding and agreed to proceed.    History of Present Illness: Alexis Carrillo is a 42 y.o. who identifies as a female who was assigned female at birth, and is being seen today for flank pain.    Ms Tew endorses right flank pain over the past 3 days. Pain is described as sharp and constant. She does have a history of hematuria, has been referred to Urology and has a cystoscopy scheduled. She is not sure what to do about her pain at this time. She has not tried any motrin or tylenol for pain.  Denies N/V/D.     Problems: There are no problems to display for this patient.   Allergies:  Allergies  Allergen Reactions   Other Itching and Rash   Adhesive [Tape] Itching and Rash   Medications: No current outpatient medications on file.  Observations/Objective: Patient is well-developed, well-nourished in no acute distress.  Resting comfortably  at home.  Head is normocephalic, atraumatic.  No labored breathing.  Speech is clear and coherent with logical content.  Patient is alert and oriented at baseline.    Assessment and Plan: 1. Right flank pain If pain is tolerable can take tylenol and follow up with PCP in am If pain is unbearable or 10/10 needs to go to the emergency room to evaluate for  kidney stone.   Follow Up Instructions: I discussed the assessment and treatment plan with the patient. The patient was provided an opportunity to ask questions and all were answered. The patient agreed with the plan and demonstrated an understanding of the instructions.  A copy of instructions were sent to the patient via MyChart unless otherwise noted below.    The patient was advised to call back or seek an in-person evaluation if the symptoms worsen or if the  condition fails to improve as anticipated.  Time:  I spent 11 minutes with the patient via telehealth technology discussing the above problems/concerns.    Gildardo Pounds, NP

## 2022-11-03 NOTE — Patient Instructions (Signed)
  Philipp Deputy, thank you for joining Gildardo Pounds, NP for today's virtual visit.  While this provider is not your primary care provider (PCP), if your PCP is located in our provider database this encounter information will be shared with them immediately following your visit.   [facility] account gives you access to today's visit and all your visits, tests, and labs performed at [facility] " click here if you don't have a [facility] account or go to [email or web address]  Consent: (Patient) Alexis Carrillo provided verbal consent for this virtual visit at the beginning of the encounter.  Current Medications: No current outpatient medications on file.   Medications ordered in this encounter:  No orders of the defined types were placed in this encounter.    *If you need refills on other medications prior to your next appointment, please contact your pharmacy*  Follow-Up: Call back or seek an in-person evaluation if the symptoms worsen or if the condition fails to improve as anticipated.  [facility] [phone]  Other Instructions If pain is tolerable can take tylenol and follow up with PCP in am If pain is unbearable or 10/10 needs to go to the emergency room to evaluate for kidney stone   If you have been instructed to have an in-person evaluation today at a local Urgent Care facility, please use the link below. It will take you to a list of all of our available [facility], including address, phone number and hours of operation. Please do not delay care.  [facility]  If you or a family member do not have a primary care provider, use the link below to schedule a visit and establish care. When you choose a [facility] primary care physician or advanced practice provider, you gain a long-term partner in health. Find a Primary Care Provider  Learn more about [facility] in-office and virtual care  options: [facility]

## 2023-03-01 ENCOUNTER — Telehealth: Payer: Federal, State, Local not specified - PPO | Admitting: Physician Assistant

## 2023-03-01 DIAGNOSIS — G510 Bell's palsy: Secondary | ICD-10-CM

## 2023-03-01 MED ORDER — VALACYCLOVIR HCL 1 G PO TABS
1000.0000 mg | ORAL_TABLET | Freq: Three times a day (TID) | ORAL | 0 refills | Status: AC
Start: 2023-03-01 — End: 2023-03-08

## 2023-03-01 MED ORDER — PREDNISONE 10 MG PO TABS: ORAL_TABLET | ORAL | 0 refills | Status: AC

## 2023-03-01 NOTE — Progress Notes (Signed)
Virtual Visit Consent   Alexis Carrillo, you are scheduled for a virtual visit with a [facility] provider today. Just as with appointments in the office, your consent must be obtained to participate. Your consent will be active for this visit and any virtual visit you may have with one of our providers in the next 365 days. If you have a MyChart account, a copy of this consent can be sent to you electronically.  As this is a virtual visit, video technology does not allow for your provider to perform a traditional examination. This may limit your provider's ability to fully assess your condition. If your provider identifies any concerns that need to be evaluated in person or the need to arrange testing (such as labs, EKG, etc.), we will make arrangements to do so. Although advances in technology are sophisticated, we cannot ensure that it will always work on either your end or our end. If the connection with a video visit is poor, the visit may have to be switched to a telephone visit. With either a video or telephone visit, we are not always able to ensure that we have a secure connection.  By engaging in this virtual visit, you consent to the provision of healthcare and authorize for your insurance to be billed (if applicable) for the services provided during this visit. Depending on your insurance coverage, you may receive a charge related to this service.  I need to obtain your verbal consent now. Are you willing to proceed with your visit today? MAKINNA EAKLE has provided verbal consent on 03/01/2023 for a virtual visit (video or telephone). Margaretann Loveless, PA-C  Date: 03/01/2023 12:38 PM  Virtual Visit via Video Note   I, Margaretann Loveless, connected with  DEMITRIA Carrillo  ([id number], [birth year]) on 03/01/23 at 12:30 PM EDT by a video-enabled telemedicine application and verified that I am speaking with the correct person using two identifiers.  Location: Patient: Virtual Visit  Location Patient: Other: work; isolated Provider: Engineer, mining Provider: Home Office   I discussed the limitations of evaluation and management by telemedicine and the availability of in person appointments. The patient expressed understanding and agreed to proceed.    History of Present Illness: Alexis Carrillo is a 42 y.o. who identifies as a female who was assigned female at birth, and is being seen today for right sided facial droop and right sided facial discomfort.  HPI: Neurologic Problem The patient's pertinent negatives include no altered mental status, clumsiness, focal sensory loss, focal weakness, loss of balance, memory loss, near-syncope, slurred speech, syncope, visual change or weakness. This is a new problem. The current episode started yesterday (last night). The neurological problem developed suddenly. The problem is unchanged. There was right-sided focality noted. Associated symptoms include fatigue and headaches (uses Topamax daily for migraine prevention). Pertinent negatives include no auditory change, bladder incontinence, bowel incontinence, confusion, diaphoresis, dizziness, light-headedness, nausea, neck pain, shortness of breath or vertigo. Past treatments include nothing. The treatment provided no relief.    Did have Chicken pox as a child twice.   Problems: There are no problems to display for this patient.   Allergies:  Allergies  Allergen Reactions   Other Itching and Rash   Adhesive [Tape] Itching and Rash   Medications:  Current Outpatient Medications:    predniSONE (DELTASONE) 10 MG tablet, Take 50mg  (5 tablets) for 5 days, then decrease by 1 tablet daily until completed, Disp: 35 tablet, Rfl: 0  valACYclovir (VALTREX) 1000 MG tablet, Take 1 tablet (1,000 mg total) by mouth 3 (three) times daily for 7 days., Disp: 21 tablet, Rfl: 0  Observations/Objective: Patient is well-developed, well-nourished in no acute distress.  Resting comfortably   at home.  Head is normocephalic, atraumatic.  No labored breathing.  Speech is clear and coherent with logical content.  Patient is alert and oriented at baseline.    Assessment and Plan: 1. Bell's palsy - valACYclovir (VALTREX) 1000 MG tablet; Take 1 tablet (1,000 mg total) by mouth 3 (three) times daily for 7 days.  Dispense: 21 tablet; Refill: 0 - predniSONE (DELTASONE) 10 MG tablet; Take 50mg  (5 tablets) for 5 days, then decrease by 1 tablet daily until completed  Dispense: 35 tablet; Refill: 0  - Highly suspicious of Bell's palsy, no warning signs or symptoms indicating CVA or other severe neurological issue - Will treat with higher dose steroid taper and Valtrex - Tylenol as needed - Wetting eye drops as needed - Warm compresses  - Strict ER precautions if worsening - Seek in person evaluation if not improving with treatment  Follow Up Instructions: I discussed the assessment and treatment plan with the patient. The patient was provided an opportunity to ask questions and all were answered. The patient agreed with the plan and demonstrated an understanding of the instructions.  A copy of instructions were sent to the patient via MyChart unless otherwise noted below.    The patient was advised to call back or seek an in-person evaluation if the symptoms worsen or if the condition fails to improve as anticipated.  Time:  I spent 15 minutes with the patient via telehealth technology discussing the above problems/concerns.    Margaretann Loveless, PA-C

## 2023-03-01 NOTE — Patient Instructions (Signed)
Alexis Carrillo, thank you for joining Alexis Loveless, PA-C for today's virtual visit.  While this provider is not your primary care provider (PCP), if your PCP is located in our provider database this encounter information will be shared with them immediately following your visit.   [facility] MyChart account gives you access to today's visit and all your visits, tests, and labs performed at [facility] " click here if you don't have a [facility] MyChart account or go to [email or web address]  Consent: (Patient) Alexis Carrillo provided verbal consent for this virtual visit at the beginning of the encounter.  Current Medications:  Current Outpatient Medications:    predniSONE (DELTASONE) 10 MG tablet, Take 50mg  (5 tablets) for 5 days, then decrease by 1 tablet daily until completed, Disp: 35 tablet, Rfl: 0   valACYclovir (VALTREX) 1000 MG tablet, Take 1 tablet (1,000 mg total) by mouth 3 (three) times daily for 7 days., Disp: 21 tablet, Rfl: 0   Medications ordered in this encounter:  Meds ordered this encounter  Medications   valACYclovir (VALTREX) 1000 MG tablet    Sig: Take 1 tablet (1,000 mg total) by mouth 3 (three) times daily for 7 days.    Dispense:  21 tablet    Refill:  0    Order Specific Question:   Supervising Provider    Answer:   Merrilee Jansky [[id number]]   predniSONE (DELTASONE) 10 MG tablet    Sig: Take 50mg  (5 tablets) for 5 days, then decrease by 1 tablet daily until completed    Dispense:  35 tablet    Refill:  0    Order Specific Question:   Supervising Provider    Answer:   Merrilee Jansky [[id number]]     *If you need refills on other medications prior to your next appointment, please contact your pharmacy*  Follow-Up: Call back or seek an in-person evaluation if the symptoms worsen or if the condition fails to improve as anticipated.  [facility] [phone]  Other Instructions Bell's Palsy, Adult  Bell's  palsy is a short-term inability to move muscles in a part of the face. The inability to move, also called paralysis, results from inflammation or compression of the seventh cranial nerve. This nerve travels along the skull and under the ear to the side of the face. This nerve is responsible for facial movements that include blinking, closing the eyes, smiling, and frowning. What are the causes? The exact cause of this condition is not known. It may be caused by an infection from a virus, such as the chickenpox (herpes zoster), Epstein-Barr, or mumps virus. What increases the risk? You are more likely to develop this condition if: You are pregnant. You have diabetes. You have had a recent infection in your nose, throat, or airways. You have a weakened body defense system (immune system). You have had a facial injury, such as a fracture. You have a family history of Bell's palsy. What are the signs or symptoms? Symptoms of this condition include: Weakness on one side of the face. Drooping eyelid and corner of the mouth. Excessive tearing in one eye. Difficulty closing the eyelid. Dry eye. Drooling. Dry mouth. Changes in taste. Change in facial appearance. Pain behind one ear. Ringing in one or both ears. Sensitivity to sound in one ear. Facial twitching. Headache. Impaired speech. Dizziness. Difficulty eating or drinking. Most of the time, only one side of the face is affected. In rare cases, Bell's  palsy may affect the whole face. How is this diagnosed? This condition is diagnosed based on: Your symptoms. Your medical history. A physical exam. You may also have to see health care providers who specialize in disorders of the nerves (neurologist) or diseases and conditions of the eye (ophthalmologist). You may have tests, such as: A test to check for nerve damage (electromyogram). Imaging studies, such as a CT scan or an MRI. Blood tests. How is this treated? This condition  affects every person differently. Sometimes symptoms go away without treatment within a couple weeks. If treatment is needed, it varies from person to person. The goal of treatment is to reduce inflammation and protect the eye from damage. Treatment for Bell's palsy may include: Medicines, such as: Steroids to reduce swelling and inflammation. Antiviral medicines. Pain relievers, including aspirin, acetaminophen, or ibuprofen. Eye drops or ointment to keep your eye moist. Eye protection, if you cannot close your eye. Exercises or massage to regain muscle strength and function (physical therapy). Follow these instructions at home:  Take over-the-counter and prescription medicines only as told by your health care provider. If your eye is affected: Keep your eye moist with eye drops or ointment as told by your health care provider. Follow instructions for eye care and protection as told by your health care provider. Do any physical therapy exercises as told by your health care provider. Keep all follow-up visits. This is important. Contact a health care provider if: You have a fever or chills. Your symptoms do not get better within 2-3 weeks, or your symptoms get worse. Your eye is red, irritated, or painful. You have new symptoms. Get help right away if: You have weakness or numbness in a part of your body other than your face. You have trouble swallowing. You develop neck pain or stiffness. You develop dizziness or shortness of breath. Summary Bell's palsy is a short-term inability to move muscles in a part of the face. The inability to move results from inflammation or compression of the facial nerve. This condition affects every person differently. Sometimes symptoms go away without treatment within a couple weeks. If treatment is needed, it varies from person to person. The goal of treatment is to reduce inflammation and protect the eye from damage. Contact your health care provider  if your symptoms do not get better within 2-3 weeks, or your symptoms get worse. This information is not intended to replace advice given to you by your health care provider. Make sure you discuss any questions you have with your health care provider. Document Revised: 06/18/2020 Document Reviewed: 06/18/2020 Elsevier Patient Education  2024 Elsevier Inc.    If you have been instructed to have an in-person evaluation today at a local Urgent Care facility, please use the link below. It will take you to a list of all of our available [facility], including address, phone number and hours of operation. Please do not delay care.  [facility]  If you or a family member do not have a primary care provider, use the link below to schedule a visit and establish care. When you choose a [facility] primary care physician or advanced practice provider, you gain a long-term partner in health. Find a Primary Care Provider  Learn more about [facility]'s in-office and virtual care options: [facility] - Get Care Now

## 2023-03-23 ENCOUNTER — Telehealth: Payer: Self-pay | Admitting: Podiatry

## 2023-03-23 NOTE — Telephone Encounter (Signed)
Lvm for pt to schedule appt for orthotic p/u.

## 2023-09-22 ENCOUNTER — Encounter: Payer: Federal, State, Local not specified - PPO | Admitting: Nurse Practitioner

## 2023-09-22 ENCOUNTER — Encounter: Payer: Self-pay | Admitting: Nurse Practitioner

## 2023-09-22 NOTE — Progress Notes (Signed)
This encounter was created in error - please disregard.
